# Patient Record
Sex: Male | Born: 1955 | Race: Black or African American | Hispanic: No | Smoking: Former smoker
Health system: Southern US, Community
[De-identification: ages and names within clinical notes are randomized; demographics above are authoritative.]

## PROBLEM LIST (undated history)

## (undated) DIAGNOSIS — Z87891 Personal history of nicotine dependence: Secondary | ICD-10-CM

## (undated) DIAGNOSIS — E039 Hypothyroidism, unspecified: Secondary | ICD-10-CM

## (undated) DIAGNOSIS — G629 Polyneuropathy, unspecified: Secondary | ICD-10-CM

## (undated) DIAGNOSIS — E785 Hyperlipidemia, unspecified: Secondary | ICD-10-CM

## (undated) DIAGNOSIS — N529 Male erectile dysfunction, unspecified: Secondary | ICD-10-CM

## (undated) DIAGNOSIS — N2581 Secondary hyperparathyroidism of renal origin: Secondary | ICD-10-CM

## (undated) DIAGNOSIS — E1122 Type 2 diabetes mellitus with diabetic chronic kidney disease: Secondary | ICD-10-CM

## (undated) DIAGNOSIS — E669 Obesity, unspecified: Secondary | ICD-10-CM

## (undated) DIAGNOSIS — E119 Type 2 diabetes mellitus without complications: Secondary | ICD-10-CM

## (undated) DIAGNOSIS — N184 Chronic kidney disease, stage 4 (severe): Secondary | ICD-10-CM

## (undated) DIAGNOSIS — I10 Essential (primary) hypertension: Secondary | ICD-10-CM

## (undated) DIAGNOSIS — G4733 Obstructive sleep apnea (adult) (pediatric): Secondary | ICD-10-CM

## (undated) DIAGNOSIS — E8722 Chronic metabolic acidosis: Secondary | ICD-10-CM

## (undated) HISTORY — DX: Hyperlipidemia, unspecified: E78.5

## (undated) HISTORY — DX: Hypothyroidism, unspecified: E03.9

## (undated) HISTORY — PX: CHOLECYSTECTOMY: SHX55

## (undated) HISTORY — DX: Type 2 diabetes mellitus with diabetic chronic kidney disease: E11.22

## (undated) HISTORY — PX: COLONOSCOPY: SHX174

## (undated) HISTORY — DX: Polyneuropathy, unspecified: G62.9

## (undated) HISTORY — PX: HERNIA REPAIR: SHX51

## (undated) HISTORY — DX: Essential (primary) hypertension: I10

## (undated) HISTORY — DX: Obstructive sleep apnea (adult) (pediatric): G47.33

## (undated) HISTORY — DX: Type 2 diabetes mellitus without complications: E11.9

---

## 2005-04-06 ENCOUNTER — Ambulatory Visit: Payer: Self-pay | Admitting: Nephrology

## 2007-03-25 IMAGING — US US RENAL KIDNEY
1 series · 17 of 24 positions shown · non-contrast
Comparison: none

REASON FOR EXAM: Chronic Kidney Disease
COMMENTS:

[Series 1: us renal kidney · 17 of 24 slices shown]
[im 1/24]
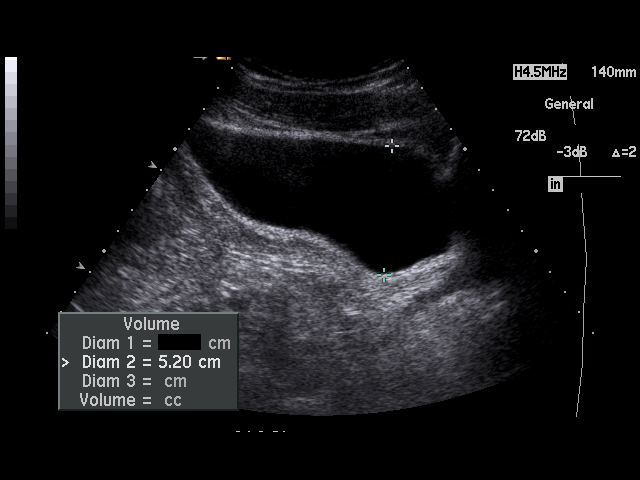
[im 3/24]
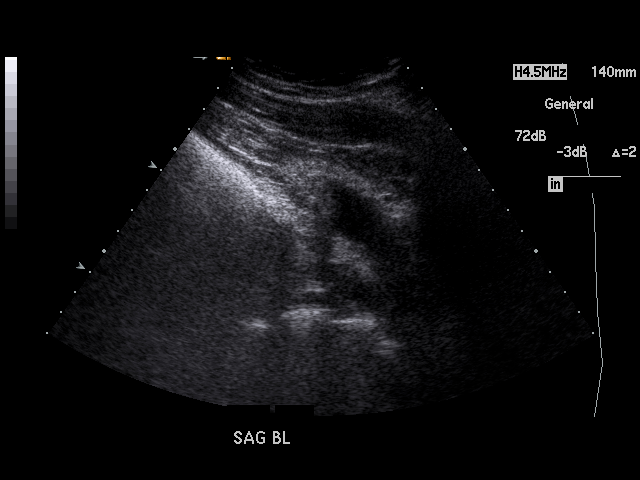
[im 4/24]
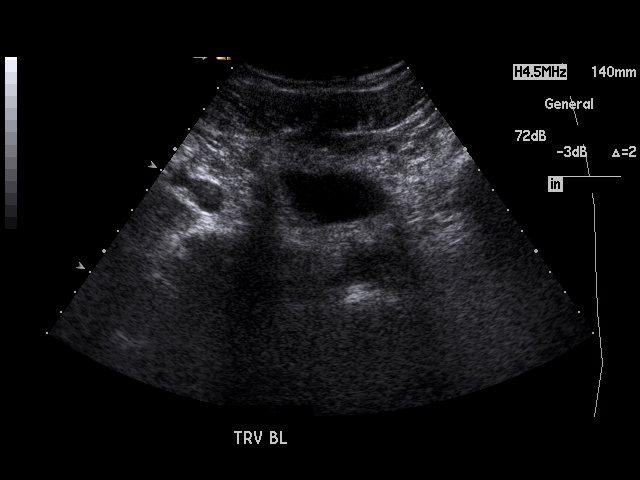
[im 5/24]
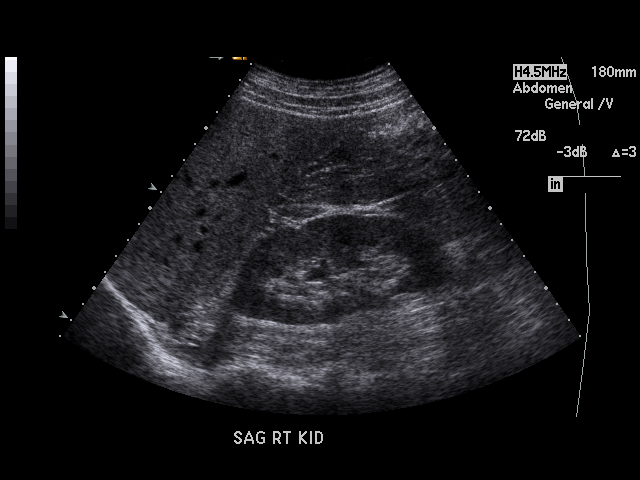
[im 7/24]
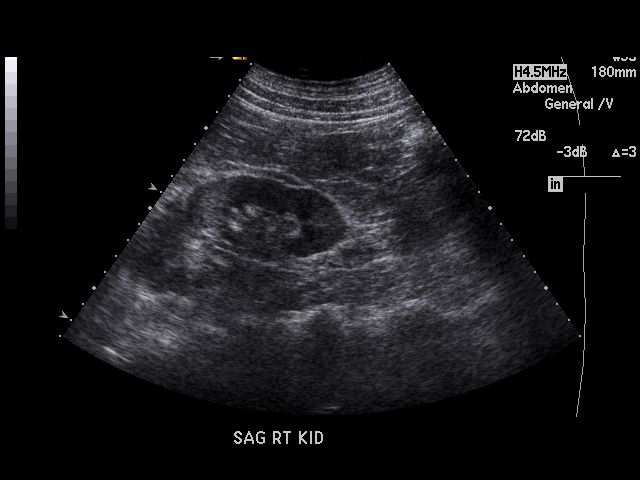
[im 8/24]
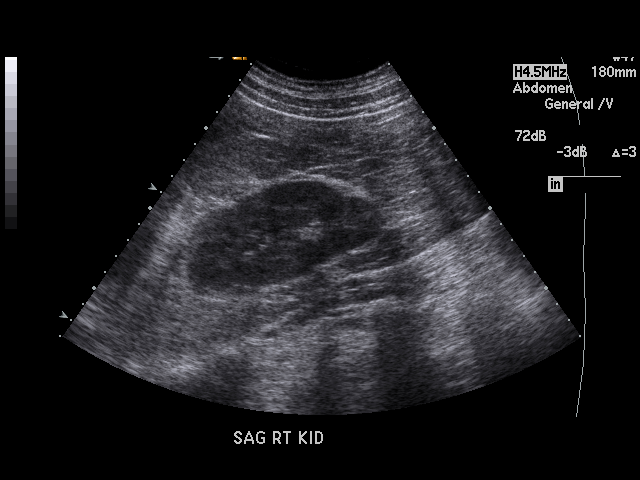
[im 10/24]
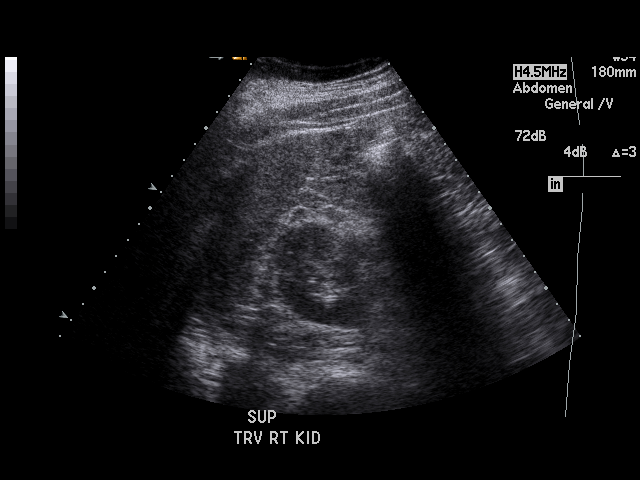
[im 11/24]
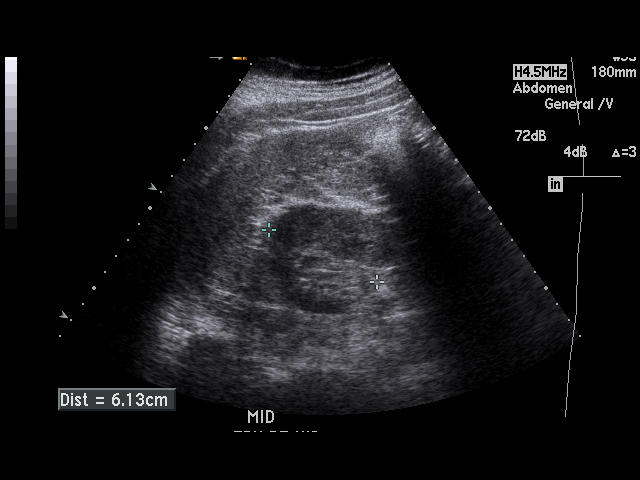
[im 13/24]
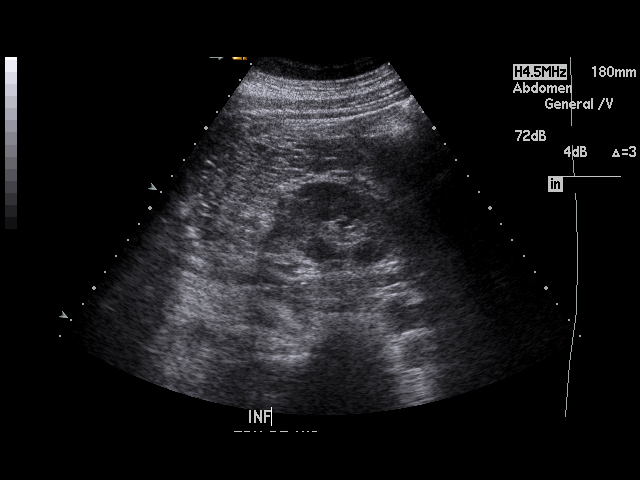
[im 14/24]
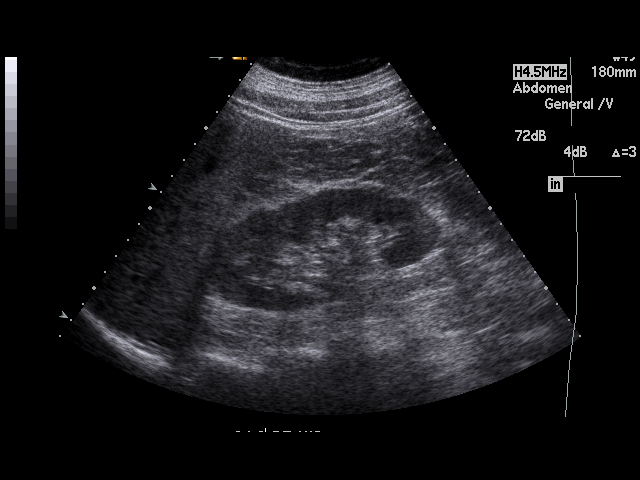
[im 15/24]
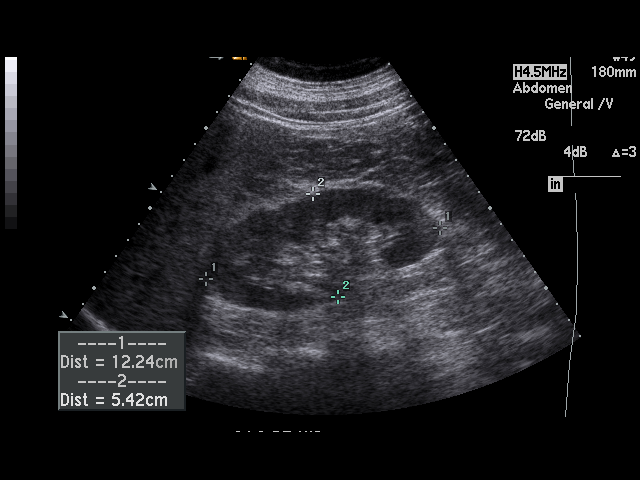
[im 17/24]
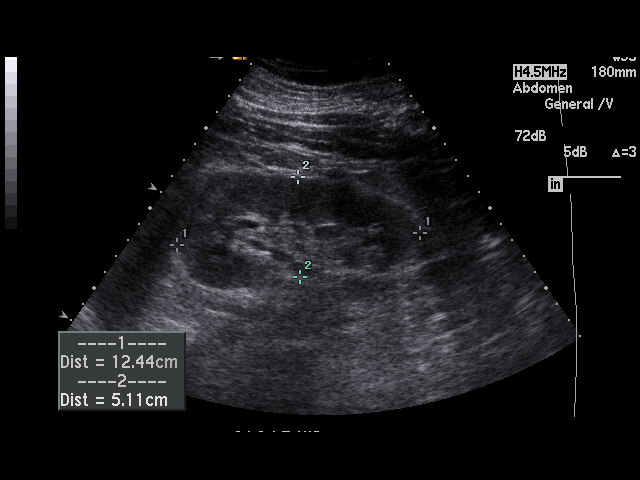
[im 18/24]
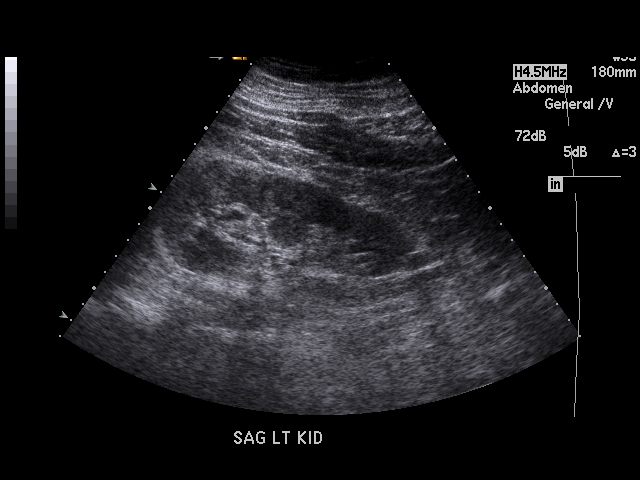
[im 20/24]
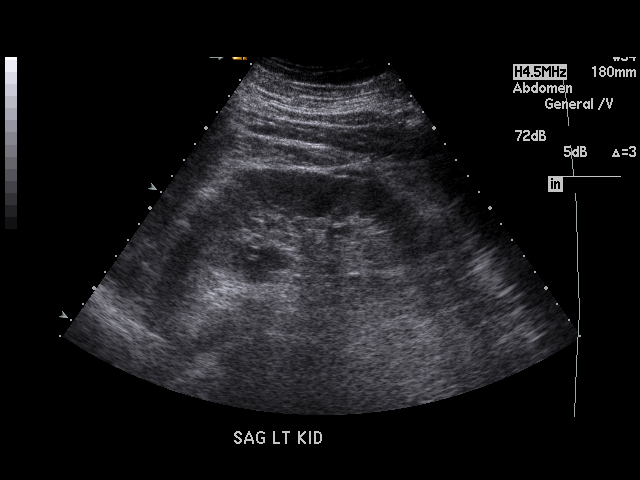
[im 21/24]
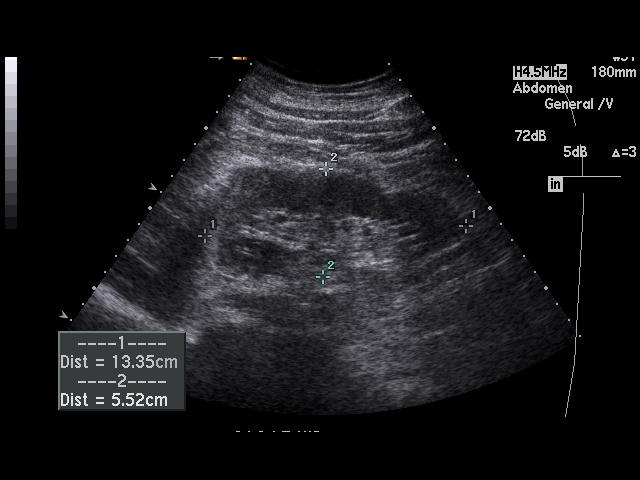
[im 22/24]
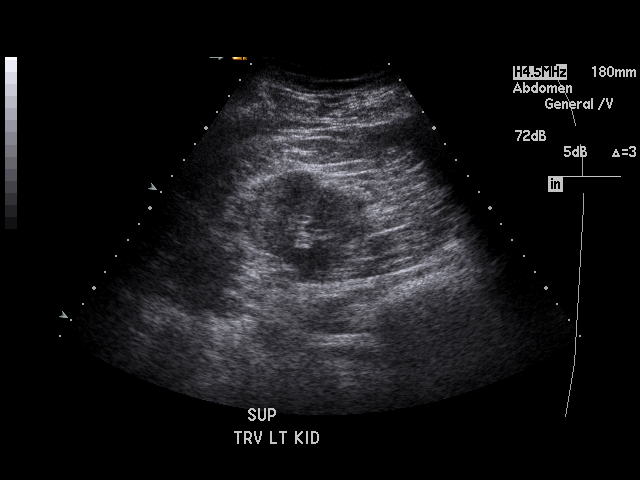
[im 24/24]
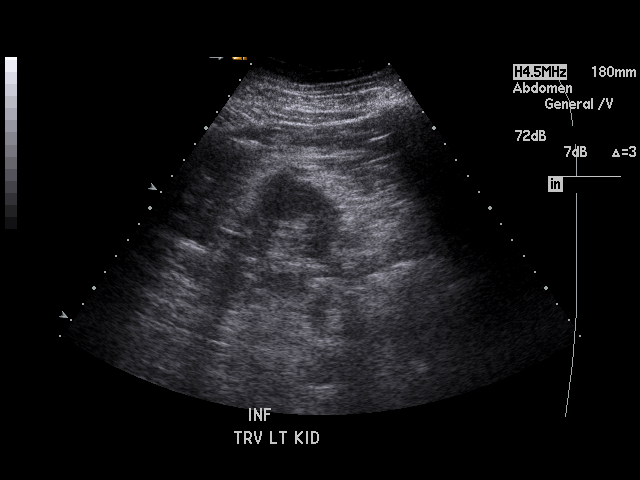

[17 of 24 positions shown; findings below may reference images not displayed]

PROCEDURE:     US  - US KIDNEY BILATERAL  - April 06, 2005  [DATE]

RESULT:        The RIGHT kidney measures 12.38 cm  x 6.13 cm x 5.57 cm and
the LEFT kidney measures 13.35 cm x 4.72 cm x 5.52 cm.  The renal cortical
margins are smooth.  No renal mass lesions or renal calcifications are
identified.  There is no hydronephrosis.  The filled urinary bladder has a
volume of approximately 219 ml.  Post-void examination shows near complete
emptying of the urinary bladder.
IMPRESSION: 1.     No acute changes are identified.
2.     The kidneys are normal in size.
3.     No hydronephrosis or renal mass lesions are identified.

## 2008-11-11 ENCOUNTER — Ambulatory Visit: Payer: Self-pay | Admitting: Gastroenterology

## 2010-05-20 ENCOUNTER — Ambulatory Visit: Payer: Self-pay | Admitting: Internal Medicine

## 2010-05-26 ENCOUNTER — Ambulatory Visit: Payer: Self-pay | Admitting: Internal Medicine

## 2010-06-13 ENCOUNTER — Ambulatory Visit: Payer: Self-pay | Admitting: Internal Medicine

## 2016-08-31 DIAGNOSIS — E7800 Pure hypercholesterolemia, unspecified: Secondary | ICD-10-CM | POA: Insufficient documentation

## 2017-01-27 ENCOUNTER — Ambulatory Visit
Admission: RE | Admit: 2017-01-27 | Discharge: 2017-01-27 | Disposition: A | Discharge status: Home / Self Care | Payer: BC Managed Care – PPO | Source: Ambulatory Visit | Attending: Family Medicine | Admitting: Family Medicine

## 2017-01-27 ENCOUNTER — Other Ambulatory Visit: Payer: Self-pay | Admitting: Family Medicine

## 2017-01-27 DIAGNOSIS — M47812 Spondylosis without myelopathy or radiculopathy, cervical region: Secondary | ICD-10-CM | POA: Insufficient documentation

## 2017-01-27 DIAGNOSIS — M542 Cervicalgia: Secondary | ICD-10-CM | POA: Insufficient documentation

## 2017-01-27 DIAGNOSIS — K118 Other diseases of salivary glands: Secondary | ICD-10-CM | POA: Diagnosis not present

## 2018-08-14 ENCOUNTER — Ambulatory Visit: Payer: BC Managed Care – PPO | Admitting: Dietician

## 2018-10-25 ENCOUNTER — Encounter: Payer: Self-pay | Admitting: Dietician

## 2018-10-25 NOTE — Progress Notes (Signed)
Patient cancelled his initial MNT appointment on 08/14/18 due to participation in a different program. Sent notification to referring provider.

## 2019-01-15 IMAGING — CT CT NECK W/O CM
4 of 5 series · 15 of 33 positions shown, 17 images · non-contrast
Comparison: None.

CLINICAL DATA: Neck pain.  Difficulty swallowing.

EXAM:
CT NECK WITHOUT CONTRAST
TECHNIQUE: Multidetector CT imaging of the neck was performed following the
standard protocol without intravenous contrast.

[Series 2: axial neck · axial · 0.55mm/px · z∈[-266,-126]mm · 4 of 118 slices shown, 5 images]
[im 24/118  soft-tissue]
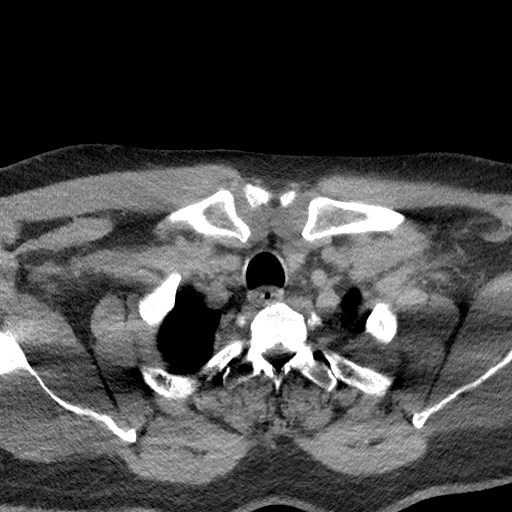
[im 24/118  bone]
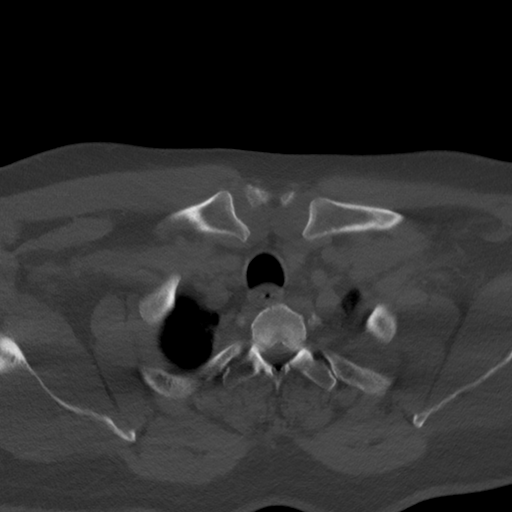
[im 47/118  bone]
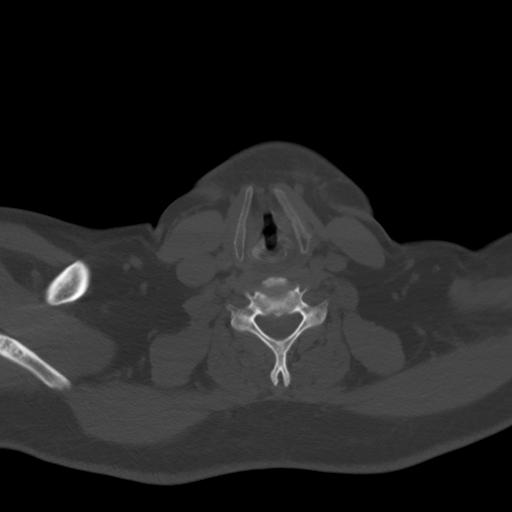
[im 71/118  bone]
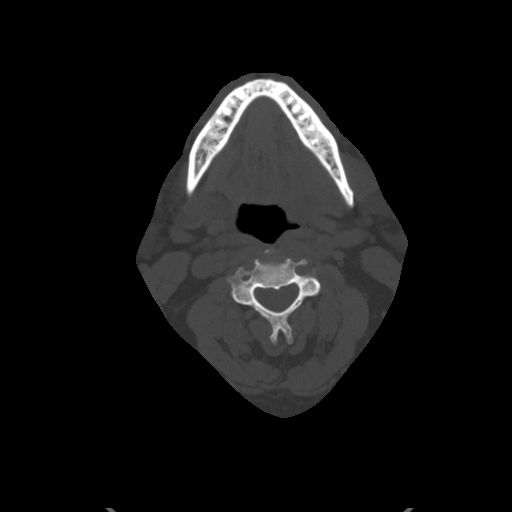
[im 94/118  bone]
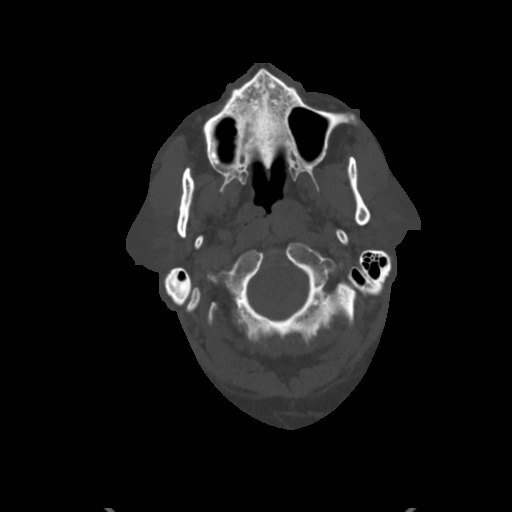

[Series 6: sag neck · sagittal · 0.48mm/px · 5 of 125 slices shown, 6 images]
[im 42/125  bone]
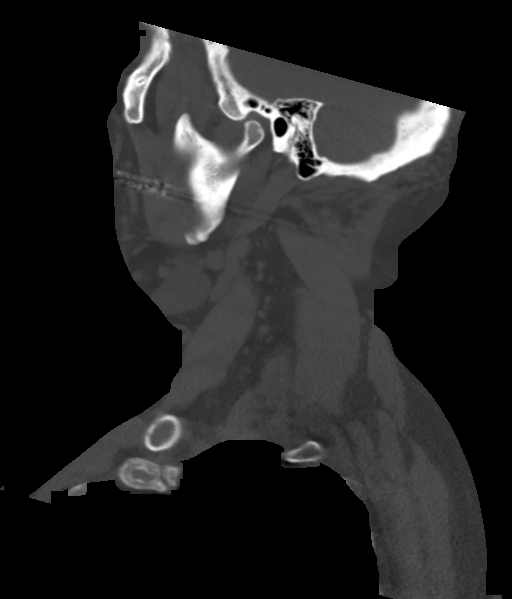
[im 52/125  bone]
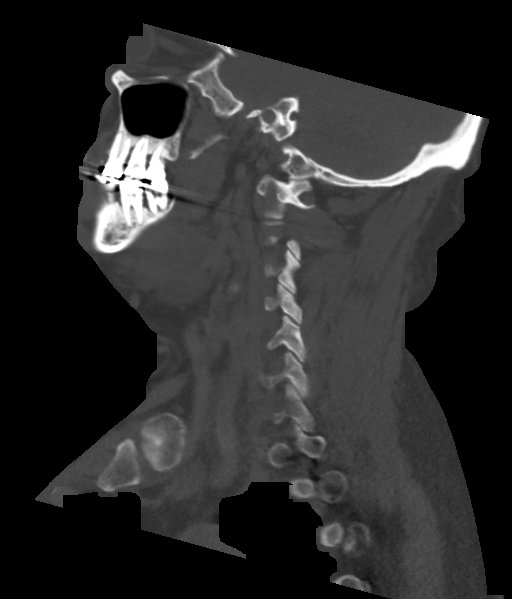
[im 63/125  soft-tissue]
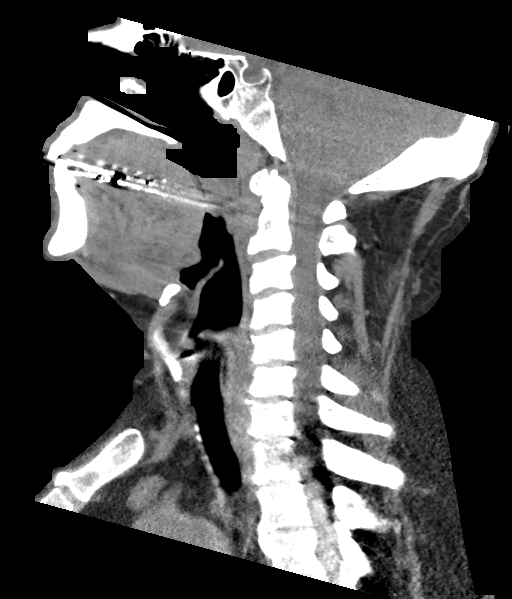
[im 63/125  bone]
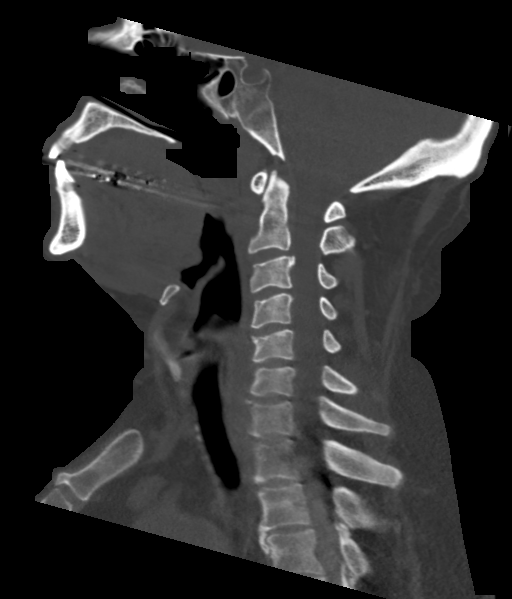
[im 73/125  bone]
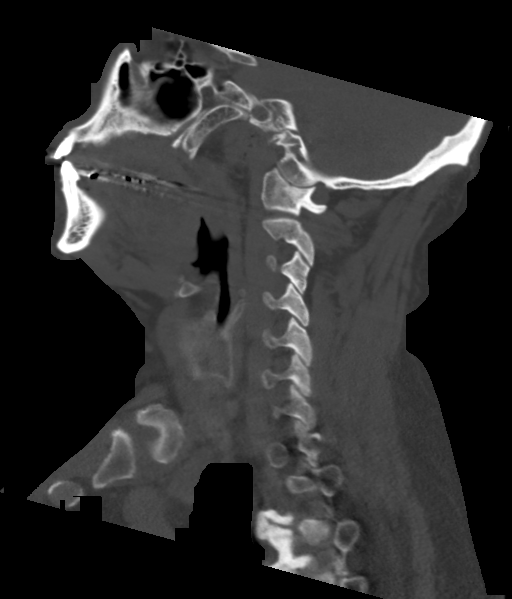
[im 83/125  bone]
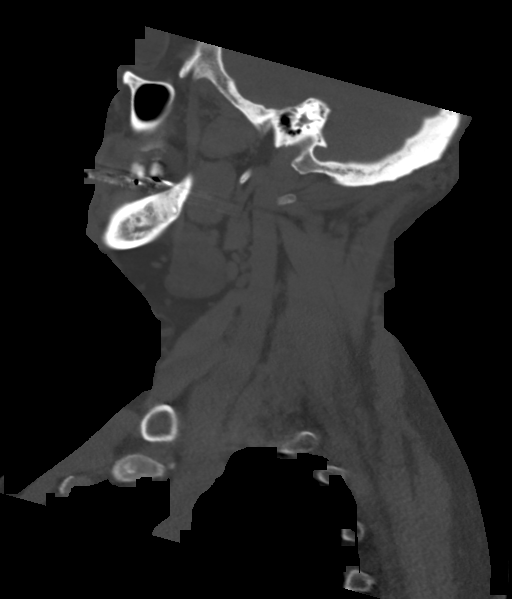

[Series 7: cor neck · coronal · 0.49mm/px · 3 of 92 slices shown]
[im 19/92  bone]
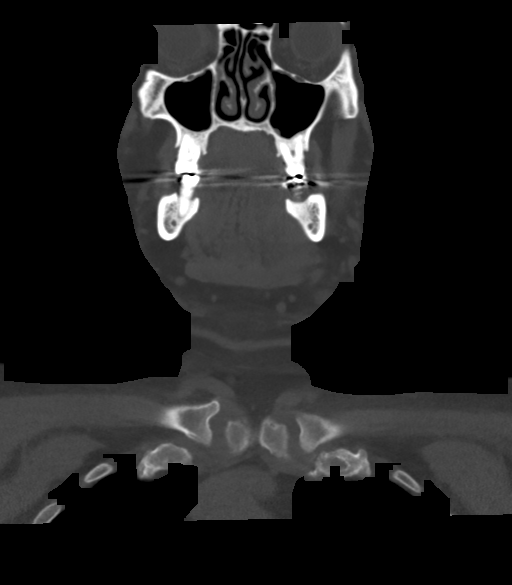
[im 37/92  bone]
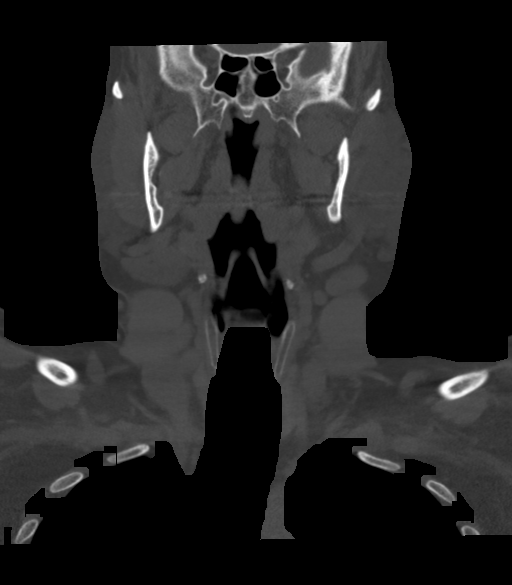
[im 55/92  bone]
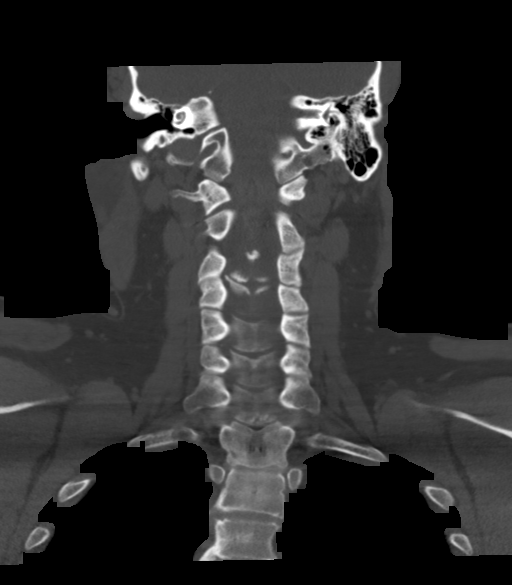

[Series 8: orthogonal ax · axial · 0.48mm/px · z∈[-289,-202]mm · 3 of 113 slices shown]
[im 23/113  bone]
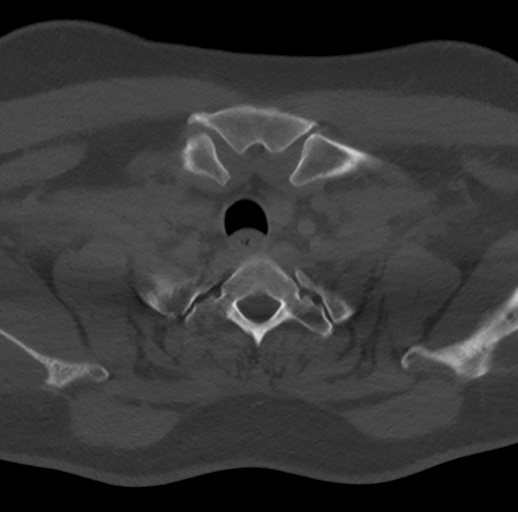
[im 45/113  bone]
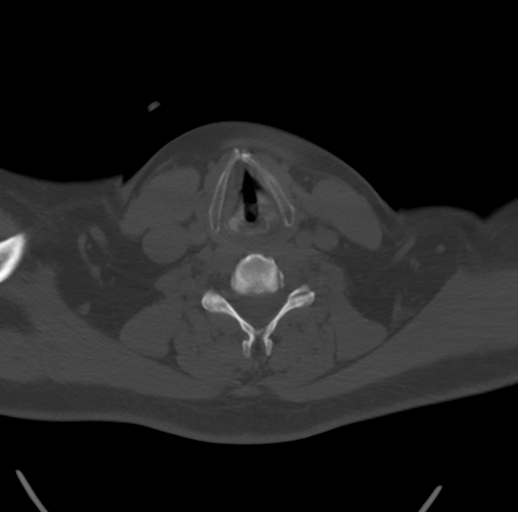
[im 68/113  bone]
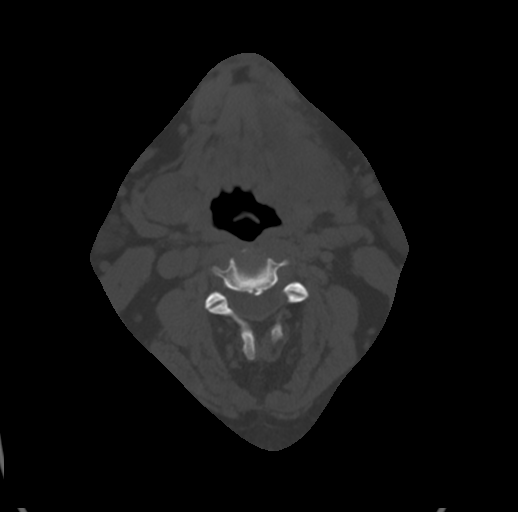

[15 of 33 positions shown; findings below may reference images not displayed]

FINDINGS: Pharynx and larynx: No focal mucosal or submucosal lesions are
present. Vocal cords are midline and symmetric. The oropharynx,
nasopharynx, and hypopharynx are clear. The soft palate and
epiglottis are within normal limits.

Salivary glands: The parotid glands are within normal limits
bilaterally. The left submandibular gland is enlarged and hypodense
compared to the right. There is marked dilation of the left
sublingual duct edema along the left floor of mouth. No definite
obstructing stone is present. There is displacement of the
mylohyoid. No discrete mass lesion is present. The right
submandibular gland is within normal limits.

Thyroid: The thyroid is heterogeneous. Multiple small nodules are
present. The dominant nodule is in the left lobe of the thyroid
inferiorly, measuring 1.6 cm.

Lymph nodes: Subcentimeter submandibular and level 2 lymph nodes
bilaterally are likely reactive. Nodes are more prominent left than
right.

Vascular: No significant vascular calcifications are present.

Limited intracranial: Within normal limits.

Visualized orbits: Globes and orbits are unremarkable.

Mastoids and visualized paranasal sinuses: The visualized paranasal
sinuses and mastoid air cells are clear.

Skeleton: Vertebral body heights and alignment are normal.
Uncovertebral spurring and foraminal narrowing is greatest at C3-4.
No focal lytic or blastic lesions are present.

Upper chest: The lung apices are clear. Superior mediastinum is
within normal limits. The thoracic inlet is otherwise unremarkable.
IMPRESSION: 1. Enlarged left submandibular duct and gland with edematous changes
compatible with obstruction and inflammation. Secondary infection is
not excluded.
2. Mass effect with displacement of the mylohyoid muscle laterally
and inferiorly.
3. Reactive type left greater than right level 1 and level 2 lymph
nodes.
4. No discrete mass lesion.
5. Mild degenerative changes of the cervical spine.

## 2020-01-20 ENCOUNTER — Ambulatory Visit (INDEPENDENT_AMBULATORY_CARE_PROVIDER_SITE_OTHER): Payer: Medicare PPO | Admitting: Internal Medicine

## 2020-01-20 DIAGNOSIS — G4733 Obstructive sleep apnea (adult) (pediatric): Secondary | ICD-10-CM | POA: Diagnosis not present

## 2020-01-20 DIAGNOSIS — Z9989 Dependence on other enabling machines and devices: Secondary | ICD-10-CM

## 2020-01-20 DIAGNOSIS — E039 Hypothyroidism, unspecified: Secondary | ICD-10-CM | POA: Insufficient documentation

## 2020-01-20 DIAGNOSIS — Z7189 Other specified counseling: Secondary | ICD-10-CM | POA: Insufficient documentation

## 2020-01-20 DIAGNOSIS — Z6836 Body mass index (BMI) 36.0-36.9, adult: Secondary | ICD-10-CM | POA: Insufficient documentation

## 2020-01-20 NOTE — Patient Instructions (Signed)

## 2020-01-20 NOTE — Progress Notes (Signed)
[facility] [place], [place] [address]  Pulmonary Sleep Medicine   Office Visit Note  Patient Name: Luke Gibson  DOB: 05-29-1955 MRN [id number]    Chief Complaint: Obstructive Sleep Apnea visit  Brief History:  Luke Gibson is seen today for initial consultation The patient has a 13 year history of sleep apnea and just received a replacement CPAP. Patient is using PAP nightly. He does not skip nights and loves his CPAP. The patient feels more rested after sleeping with PAP.  The patient reports significant benefit from PAP use. Reported sleepiness is  improved and the Epworth Sleepiness Score is 2 out of 24. The patient does ot take naps. The patient complains of the following: condensation  The compliance download shows excellent compliance with an average use time of 7.3 hours. The AHI is 0.3  The patient does not complain of limb movements disrupting sleep.  ROS  General: (-) fever, (-) chills, (-) night sweat Nose and Sinuses: (-) nasal stuffiness or itchiness, (-) postnasal drip, (-) nosebleeds, (-) sinus trouble. Mouth and Throat: (-) sore throat, (-) hoarseness. Neck: (-) swollen glands, (-) enlarged thyroid, (-) neck pain. Respiratory: - cough, - shortness of breath, - wheezing. Neurologic: - numbness, - tingling. Psychiatric: - anxiety, - depression   Current Medication: Outpatient Encounter Medications as of 01/20/2020  Medication Sig  . doxazosin (CARDURA) 2 MG tablet   . levothyroxine (SYNTHROID) 137 MCG tablet Take by mouth.  . quinapril (ACCUPRIL) 40 MG tablet   . Cholecalciferol 25 MCG (1000 UT) capsule Take 1 capsule by mouth every morning.  . Garlic Oil 7169 MG CAPS Take by mouth.  Luke Gibson Berries 565 MG CAPS Take by mouth.   No facility-administered encounter medications on file as of 01/20/2020.    Surgical History: Past Surgical History:  Procedure Laterality Date  . CHOLECYSTECTOMY      Medical History: Past Medical  History:  Diagnosis Date  . CKD stage 4 due to type 2 diabetes mellitus ([facility])   . HLD (hyperlipidemia)   . HTN (hypertension)   . Hypothyroidism   . Neuropathy   . OSA on CPAP   . T2DM (type 2 diabetes mellitus) ([facility])     Family History: Non contributory to the present illness  Social History: Social History   Socioeconomic History  . Marital status: Single    Spouse name: Not on file  . Number of children: Not on file  . Years of education: Not on file  . Highest education level: Not on file  Occupational History  . Not on file  Tobacco Use  . Smoking status: Former Research scientist (life sciences)  . Smokeless tobacco: Never Used  Substance and Sexual Activity  . Alcohol use: Not on file  . Drug use: Not on file  . Sexual activity: Not on file  Other Topics Concern  . Not on file  Social History Narrative  . Not on file   Social Determinants of Health   Financial Resource Strain:   . Difficulty of Paying Living Expenses: Not on file  Food Insecurity:   . Worried About Charity fundraiser in the Last Year: Not on file  . Ran Out of Food in the Last Year: Not on file  Transportation Needs:   . Lack of Transportation (Medical): Not on file  . Lack of Transportation (Non-Medical): Not on file  Physical Activity:   . Days of Exercise per Week: Not on file  . Minutes of Exercise per  Session: Not on file  Stress:   . Feeling of Stress : Not on file  Social Connections:   . Frequency of Communication with Friends and Family: Not on file  . Frequency of Social Gatherings with Friends and Family: Not on file  . Attends Religious Services: Not on file  . Active Member of Clubs or Organizations: Not on file  . Attends Archivist Meetings: Not on file  . Marital Status: Not on file  Intimate Partner Violence:   . Fear of Current or Ex-Partner: Not on file  . Emotionally Abused: Not on file  . Physically Abused: Not on file  . Sexually Abused: Not on file    Vital Signs: Blood  pressure (!) 153/93, pulse 71, height 5\' 11"  (1.803 m), weight 260 lb (117.9 kg), SpO2 98 %.  Examination: General Appearance: The patient is well-developed, well-nourished, and in no distress. Neck Circumference: 43 Skin: Gross inspection of skin unremarkable. Head: normocephalic, no gross deformities. Eyes: no gross deformities noted. ENT: ears appear grossly normal Neurologic: Alert and oriented. No involuntary movements.    EPWORTH SLEEPINESS SCALE:  Scale:  (0)= no chance of dozing; (1)= slight chance of dozing; (2)= moderate chance of dozing; (3)= high chance of dozing  Chance  Situtation    Sitting and reading: 0    Watching TV: 1    Sitting Inactive in public: 0    As a passenger in car: 0      Lying down to rest: 1    Sitting and talking: 0    Sitting quielty after lunch: 0    In a car, stopped in traffic: 0   TOTAL SCORE:   2 out of 24    SLEEP STUDIES:  1. Split 11/08/06 AHI 114 SpO41min 81%, CPAP 10 cm H2O   CPAP COMPLIANCE DATA:  Date Range: 12/18/19  Average Daily Use: 01/16/20 hours  Median Use: 7.3  Compliance for > 4 Hours: 7.3 days  AHI: 100%iratory events per hour  Days Used: 0.3  Mask Leak: 4.6  95th Percentile Pressure: 10   LABS: No results found for this or any previous visit (from the past 2160 hour(s)).  Radiology: CT SOFT TISSUE NECK WO CONTRAST  Result Date: 01/27/2017 CLINICAL DATA:  Neck pain.  Difficulty swallowing. EXAM: CT NECK WITHOUT CONTRAST TECHNIQUE: Multidetector CT imaging of the neck was performed following the standard protocol without intravenous contrast. COMPARISON:  None. FINDINGS: Pharynx and larynx: No focal mucosal or submucosal lesions are present. Vocal cords are midline and symmetric. The oropharynx, nasopharynx, and hypopharynx are clear. The soft palate and epiglottis are within normal limits. Salivary glands: The parotid glands are within normal limits bilaterally. The left submandibular gland is  enlarged and hypodense compared to the right. There is marked dilation of the left sublingual duct edema along the left floor of mouth. No definite obstructing stone is present. There is displacement of the mylohyoid. No discrete mass lesion is present. The right submandibular gland is within normal limits. Thyroid: The thyroid is heterogeneous. Multiple small nodules are present. The dominant nodule is in the left lobe of the thyroid inferiorly, measuring 1.6 cm. Lymph nodes: Subcentimeter submandibular and level 2 lymph nodes bilaterally are likely reactive. Nodes are more prominent left than right. Vascular: No significant vascular calcifications are present. Limited intracranial: Within normal limits. Visualized orbits: Globes and orbits are unremarkable. Mastoids and visualized paranasal sinuses: The visualized paranasal sinuses and mastoid air cells are clear. Skeleton: Vertebral body heights  and alignment are normal. Uncovertebral spurring and foraminal narrowing is greatest at C3-4. No focal lytic or blastic lesions are present. Upper chest: The lung apices are clear. Superior mediastinum is within normal limits. The thoracic inlet is otherwise unremarkable. IMPRESSION: 1. Enlarged left submandibular duct and gland with edematous changes compatible with obstruction and inflammation. Secondary infection is not excluded. 2. Mass effect with displacement of the mylohyoid muscle laterally and inferiorly. 3. Reactive type left greater than right level 1 and level 2 lymph nodes. 4. No discrete mass lesion. 5. Mild degenerative changes of the cervical spine. Electronically Signed   By: San Morelle M.D.   On: 01/27/2017 14:01    No results found.  No results found.    Assessment and Plan: Patient Active Problem List   Diagnosis Date Noted  . OSA on CPAP 01/20/2020  . CPAP use counseling 01/20/2020  . Hypothyroidism 01/20/2020  . BMI 36.0-36.9,adult 01/20/2020      The patient does  tolerate PAP and reports significant benefit from PAP use. The patient is caring for his machine as advised and advised to add heated tubing. The patient was also counselled on the importance of weight loss in treating the sleep apnea. The compliance is excellent. The apnea is very well controlled.   1. OSA- continue excellent compliance. 2. CPAP couseling-Discussed importance of adequate CPAP use as well as proper care and cleaning techniques of machine and all supplies. 3. Hypothyroidism-Levels stable and well controlled on current dose of levothyroxine per his report, managed by PCP 4. BMI 36-Discussed the importance of weight management through healthy eating and daily exercise as tolerated. Discussed the negative effects obesity has on pulmonary health, cardiac health as well as overall general health and well being.  General Counseling: I have discussed the findings of the evaluation and examination with Luke Gibson.  I have also discussed any further diagnostic evaluation thatmay be needed or ordered today. Luke Gibson verbalizes understanding of the findings of todays visit. We also reviewed his medications today and discussed drug interactions and side effects including but not limited excessive drowsiness and altered mental states. We also discussed that there is always a risk not just to him but also people around him. he has been encouraged to call the office with any questions or concerns that should arise related to todays visit.  I have personally obtained a history, examined the patient, evaluated laboratory and imaging results, formulated the assessment and plan and placed orders.  This patient was seen by Luiz Ochoa, AGNP-C in collaboration with Dr. Devona Konig as a part of collaborative care agreement.  Luke Ito Saunders Glance, PhD, FAASM  Diplomate, American Board of Sleep Medicine    Luke Gee, MD [facility] Diplomate ABMS Pulmonary and Critical Care Medicine Sleep medicine

## 2021-01-18 ENCOUNTER — Ambulatory Visit (INDEPENDENT_AMBULATORY_CARE_PROVIDER_SITE_OTHER): Payer: Medicare PPO | Admitting: Internal Medicine

## 2021-01-18 ENCOUNTER — Other Ambulatory Visit: Payer: Self-pay

## 2021-01-18 VITALS — BP 171/98 | HR 80 | Resp 18 | Ht 71.0 in | Wt 265.0 lb

## 2021-01-18 DIAGNOSIS — G4733 Obstructive sleep apnea (adult) (pediatric): Secondary | ICD-10-CM | POA: Diagnosis not present

## 2021-01-18 DIAGNOSIS — I10 Essential (primary) hypertension: Secondary | ICD-10-CM | POA: Diagnosis not present

## 2021-01-18 DIAGNOSIS — E669 Obesity, unspecified: Secondary | ICD-10-CM | POA: Insufficient documentation

## 2021-01-18 DIAGNOSIS — E1122 Type 2 diabetes mellitus with diabetic chronic kidney disease: Secondary | ICD-10-CM | POA: Insufficient documentation

## 2021-01-18 DIAGNOSIS — Z9989 Dependence on other enabling machines and devices: Secondary | ICD-10-CM

## 2021-01-18 DIAGNOSIS — E039 Hypothyroidism, unspecified: Secondary | ICD-10-CM | POA: Insufficient documentation

## 2021-01-18 DIAGNOSIS — Z7189 Other specified counseling: Secondary | ICD-10-CM

## 2021-01-18 DIAGNOSIS — N184 Chronic kidney disease, stage 4 (severe): Secondary | ICD-10-CM | POA: Insufficient documentation

## 2021-01-18 DIAGNOSIS — N529 Male erectile dysfunction, unspecified: Secondary | ICD-10-CM | POA: Insufficient documentation

## 2021-01-18 NOTE — Patient Instructions (Signed)

## 2021-01-18 NOTE — Progress Notes (Signed)
[facility] [address], [place] [id number]  Pulmonary Sleep Medicine   Office Visit Note  Patient Name: Luke Gibson DOB: [birth year] MRN [id number]    Chief Complaint: Obstructive Sleep Apnea visit  Brief History:  Luke Gibson is seen today for one year follow up.  The patient has a 14 year history of sleep apnea. Patient is using PAP nightly 10 cmH2O.  The patient feels refreshed after sleeping with PAP.  The patient reports benefiting from PAP use.  Epworth Sleepiness Score is 2 out of 24. The patient does not take naps. The patient complains of the following: no problems at this time  The compliance download shows  compliance with an average use time of 7:24 hours @ 100%. The AHI is 0.4  The patient does not complain of limb movements disrupting sleep.  ROS  General: (-) fever, (-) chills, (-) night sweat Nose and Sinuses: (-) nasal stuffiness or itchiness, (-) postnasal drip, (-) nosebleeds, (-) sinus trouble. Mouth and Throat: (-) sore throat, (-) hoarseness. Neck: (-) swollen glands, (-) enlarged thyroid, (-) neck pain. Respiratory: - cough, - shortness of breath, - wheezing. Neurologic: - numbness, - tingling. Psychiatric: - anxiety, - depression   Current Medication: Outpatient Encounter Medications as of 01/18/2021  Medication Sig   Cholecalciferol 25 MCG (1000 UT) capsule Take 1 capsule by mouth every morning.   doxazosin (CARDURA) 2 MG tablet    Garlic Oil 9833 MG CAPS Take by mouth.   Haw[place]orn Berries 565 MG CAPS Take by mouth.   levothyroxine (SYNTHROID) 137 MCG tablet Take by mouth.   quinapril (ACCUPRIL) 40 MG tablet    No facility-administered encounter medications on file as of 01/18/2021.    Surgical History: Past Surgical History:  Procedure Laterality Date   CHOLECYSTECTOMY      Medical History: Past Medical History:  Diagnosis Date   CKD stage 4 due to type 2 diabetes mellitus ([facility])    HLD (hyperlipidemia)    HTN  (hypertension)    Hypothyroidism    Neuropathy    OSA on CPAP    T2DM (type 2 diabetes mellitus) ([facility])     Family History: Non contributory to the present illness  Social History: Social History   Socioeconomic History   Marital status: Single    Spouse name: Not on file   Number of children: Not on file   Years of education: Not on file   Highest education level: Not on file  Occupational History   Not on file  Tobacco Use   Smoking status: Former   Smokeless tobacco: Never  Substance and Sexual Activity   Alcohol use: Not on file   Drug use: Not on file   Sexual activity: Not on file  Other Topics Concern   Not on file  Social History Narrative   Not on file   Social Determinants of Health   Financial Resource Strain: Not on file  Food Insecurity: Not on file  Transportation Needs: Not on file  Physical Activity: Not on file  Stress: Not on file  Social Connections: Not on file  Intimate Partner Violence: Not on file    Vital Signs: There were no vitals taken for this visit.  Examination: General Appearance: The patient is well-developed, well-nourished, and in no distress. Neck Circumference: 43 Skin: Gross inspection of skin unremarkable. Head: normocephalic, no gross deformities. Eyes: no gross deformities noted. ENT: ears appear grossly normal Neurologic: Alert and oriented. No involuntary movements.  EPWORTH SLEEPINESS SCALE:  Scale:  (0)= no chance of dozing; (1)= slight chance of dozing; (2)= moderate chance of dozing; (3)= high chance of dozing  Chance  Situtation    Sitting and reading: 0    Watching TV: 1    Sitting Inactive in public: 0    As a passenger in car: 0      Lying down to rest: 1    Sitting and talking: 0    Sitting quielty after lunch: 0    In a car, stopped in traffic: 0   TOTAL SCORE:   2 out of 24    SLEEP STUDIES:  Split 11/08/06 - AHI 114 SpO22min 81%   CPAP COMPLIANCE DATA:  Date Range: 01/14/20  - 01/12/21  Average Daily Use: 7:24 hours  Median Use: 7:22 hours  Compliance for > 4 Hours: 100%  AHI: 0.4 respiratory events per hour  Days Used: 364/365  Mask Leak: 3.9 lpm  95th Percentile Pressure: 10 cmH2O         LABS: No results found for this or any previous visit (from the past 2160 hour(s)).  Radiology: CT SOFT TISSUE NECK WO CONTRAST  Result Date: 01/27/2017 CLINICAL DATA:  Neck pain.  Difficulty swallowing. EXAM: CT NECK WITHOUT CONTRAST TECHNIQUE: Multidetector CT imaging of the neck was performed following the standard protocol without intravenous contrast. COMPARISON:  None. FINDINGS: Pharynx and larynx: No focal mucosal or submucosal lesions are present. Vocal cords are midline and symmetric. The oropharynx, nasopharynx, and hypopharynx are clear. The soft palate and epiglottis are within normal limits. Salivary glands: The parotid glands are within normal limits bilaterally. The left submandibular gland is enlarged and hypodense compared to the right. There is marked dilation of the left sublingual duct edema along the left floor of mouth. No definite obstructing stone is present. There is displacement of the mylohyoid. No discrete mass lesion is present. The right submandibular gland is within normal limits. Thyroid: The thyroid is heterogeneous. Multiple small nodules are present. The dominant nodule is in the left lobe of the thyroid inferiorly, measuring 1.6 cm. Lymph nodes: Subcentimeter submandibular and level 2 lymph nodes bilaterally are likely reactive. Nodes are more prominent left than right. Vascular: No significant vascular calcifications are present. Limited intracranial: Within normal limits. Visualized orbits: Globes and orbits are unremarkable. Mastoids and visualized paranasal sinuses: The visualized paranasal sinuses and mastoid air cells are clear. Skeleton: Vertebral body heights and alignment are normal. Uncovertebral spurring and foraminal  narrowing is greatest at C3-4. No focal lytic or blastic lesions are present. Upper chest: The lung apices are clear. Superior mediastinum is within normal limits. The thoracic inlet is otherwise unremarkable. IMPRESSION: 1. Enlarged left submandibular duct and gland with edematous changes compatible with obstruction and inflammation. Secondary infection is not excluded. 2. Mass effect with displacement of the mylohyoid muscle laterally and inferiorly. 3. Reactive type left greater than right level 1 and level 2 lymph nodes. 4. No discrete mass lesion. 5. Mild degenerative changes of the cervical spine. Electronically Signed   By: San Morelle M.D.   On: 01/27/2017 14:01    No results found.  No results found.    Assessment and Plan: Patient Active Problem List   Diagnosis Date Noted   OSA on CPAP 01/20/2020   CPAP use counseling 01/20/2020   Hypothyroidism 01/20/2020   BMI 36.0-36.9,adult 01/20/2020   1. OSA on CPAP The patient does tolerate PAP and reports definite benefit from PAP use. The patient was  reminded how to clean equipment and advised to replace supplies routinely. The patient was also counselled on weight loss. The compliance is excellent. The AHI is 0.4.   OSA- continue excellent compliance with pap. F/u one year.    2. CPAP use counseling CPAP Counseling: had a lengthy discussion with the patient regarding the importance of PAP therapy in management of the sleep apnea. Patient appears to understand the risk factor reduction and also understands the risks associated with untreated sleep apnea. Patient will try to make a good faith effort to remain compliant with therapy. Also instructed the patient on proper cleaning of the device including the water must be changed daily if possible and use of distilled water is preferred. Patient understands that the machine should be regularly cleaned with appropriate recommended cleaning solutions that do not damage the PAP machine  for example given white vinegar and water rinses. Other methods such as ozone treatment may not be as good as these simple methods to achieve cleaning.   3. Obesity (BMI 30-39.9) Obesity Counseling: Had a lengthy discussion regarding patients BMI and weight issues. Patient was instructed on portion control as well as increased activity. Also discussed caloric restrictions with trying to maintain intake less than 2000 Kcal. Discussions were made in accordance with the 5As of weight management. Simple actions such as not eating late and if able to, taking a walk is suggested.   4. Hypertension, unspecified type Hypertension Counseling:   The following hypertensive lifestyle modification were recommended and discussed:  1. Limiting alcohol intake to less than 1 oz/day of ethanol:(24 oz of beer or 8 oz of wine or 2 oz of 100-proof whiskey). 2. Take baby ASA 81 mg daily. 3. Importance of regular aerobic exercise and losing weight. 4. Reduce dietary saturated fat and cholesterol intake for overall cardiovascular health. 5. Maintaining adequate dietary potassium, calcium, and magnesium intake. 6. Regular monitoring of the blood pressure. 7. Reduce sodium intake to less than 100 mmol/day (less than 2.3 gm of sodium or less than 6 gm of sodium choride)       General Counseling: I have discussed the findings of the evaluation and examination with Luke Gibson.  I have also discussed any further diagnostic evaluation thatmay be needed or ordered today. Luke Gibson verbalizes understanding of the findings of todays visit. We also reviewed his medications today and discussed drug interactions and side effects including but not limited excessive drowsiness and altered mental states. We also discussed that there is always a risk not just to him but also people around him. he has been encouraged to call the office with any questions or concerns that should arise related to todays visit.  No orders of the defined types  were placed in this encounter.       I have personally obtained a history, examined the patient, evaluated laboratory and imaging results, formulated the assessment and plan and placed orders. This patient was seen today by Tressie Ellis, PA-C in collaboration with Dr. Devona Konig.   Allyne Gee, MD [facility] Diplomate ABMS Pulmonary Critical Care Medicine and Sleep Medicine

## 2022-01-16 NOTE — Progress Notes (Signed)
[facility] [place], [place] [address]  Pulmonary Sleep Medicine   Office Visit Note  Patient Name: Luke Gibson DOB: Jan 07, 1956 MRN [id number]    Chief Complaint: Obstructive Sleep Apnea visit  Brief History:  Luke Gibson is seen today for an annual follow up on CPAP@10cmh20 .  The patient has a 15 year history of sleep apnea. Patient is using PAP nightly.  The patient feels rested after sleeping with PAP.  The patient reports benefiting from PAP use. Reported sleepiness is  improved and the Epworth Sleepiness Score is 2 out of 24. The patient does not take naps. The patient complains of the following: No complaints.  The compliance download shows 100% compliance with an average use time of 7.5 hours. The AHI is 0.2  The patient does not complain of limb movements disrupting sleep.  ROS  General: (-) fever, (-) chills, (-) night sweat Nose and Sinuses: (-) nasal stuffiness or itchiness, (-) postnasal drip, (-) nosebleeds, (-) sinus trouble. Mouth and Throat: (-) sore throat, (-) hoarseness. Neck: (-) swollen glands, (-) enlarged thyroid, (-) neck pain. Respiratory: - cough, - shortness of breath, - wheezing. Neurologic: - numbness, - tingling. Psychiatric: - anxiety, - depression   Current Medication: Outpatient Encounter Medications as of 01/17/2022  Medication Sig   Cholecalciferol 25 MCG (1000 UT) capsule Take 1 capsule by mouth every morning.   doxazosin (CARDURA) 2 MG tablet    Garlic Oil 5361 MG CAPS Take by mouth.   Hawthorn Berries 565 MG CAPS Take by mouth.   levothyroxine (SYNTHROID) 137 MCG tablet Take by mouth.   quinapril (ACCUPRIL) 40 MG tablet    tadalafil, PAH, (ADCIRCA) 20 MG tablet Take 1 tablet by mouth daily as needed.   No facility-administered encounter medications on file as of 01/17/2022.    Surgical History: Past Surgical History:  Procedure Laterality Date   CHOLECYSTECTOMY     HERNIA REPAIR      Medical  History: Past Medical History:  Diagnosis Date   CKD stage 4 due to type 2 diabetes mellitus (HCC)    HLD (hyperlipidemia)    HTN (hypertension)    Hypothyroidism    Neuropathy    OSA on CPAP    T2DM (type 2 diabetes mellitus) (HCC)     Family History: Non contributory to the present illness  Social History: Social History   Socioeconomic History   Marital status: Single    Spouse name: Not on file   Number of children: Not on file   Years of education: Not on file   Highest education level: Not on file  Occupational History   Not on file  Tobacco Use   Smoking status: Former   Smokeless tobacco: Never  Substance and Sexual Activity   Alcohol use: Not on file   Drug use: Not on file   Sexual activity: Not on file  Other Topics Concern   Not on file  Social History Narrative   Not on file   Social Determinants of Health   Financial Resource Strain: Not on file  Food Insecurity: Not on file  Transportation Needs: Not on file  Physical Activity: Not on file  Stress: Not on file  Social Connections: Not on file  Intimate Partner Violence: Not on file    Vital Signs: There were no vitals taken for this visit. There is no height or weight on file to calculate BMI.    Examination: General Appearance: The patient is well-developed, well-nourished, and in  no distress. Neck Circumference: 44 cm Skin: Gross inspection of skin unremarkable. Head: normocephalic, no gross deformities. Eyes: no gross deformities noted. ENT: ears appear grossly normal Neurologic: Alert and oriented. No involuntary movements.  STOP BANG RISK ASSESSMENT S (snore) Have you been told that you snore?     NO   T (tired) Are you often tired, fatigued, or sleepy during the day?   NO  O (obstruction) Do you stop breathing, choke, or gasp during sleep? NO   P (pressure) Do you have or are you being treated for high blood pressure? YES   B (BMI) Is your body index greater than 35 kg/m?  YES   A (age) Are you 67 years old or older? YES   N (neck) Do you have a neck circumference greater than 16 inches?   YES   G (gender) Are you a male? YES   TOTAL STOP/BANG "YES" ANSWERS 5       A STOP-Bang score of 2 or less is considered low risk, and a score of 5 or more is high risk for having either moderate or severe OSA. For people who score 3 or 4, doctors may need to perform further assessment to determine how likely they are to have OSA.         EPWORTH SLEEPINESS SCALE:  Scale:  (0)= no chance of dozing; (1)= slight chance of dozing; (2)= moderate chance of dozing; (3)= high chance of dozing  Chance  Situtation    Sitting and reading: 0    Watching TV: 1    Sitting Inactive in public: 0    As a passenger in car: 0      Lying down to rest: 1    Sitting and talking: 0    Sitting quielty after lunch: 0    In a car, stopped in traffic: 0   TOTAL SCORE:   2 out of 24    SLEEP STUDIES:  Split 11/08/06 - AHI 114 SpO46min 81%    CPAP COMPLIANCE DATA:  Date Range: 01/15/21-01/14/2022  Average Daily Use: 7.5 hours  Median Use: 7.5 hrs  Compliance for > 4 Hours: 100% days  AHI: 0.2 respiratory events per hour  Days Used: 364/365  Mask Leak: 2.7  95th Percentile Pressure: 10         LABS: No results found for this or any previous visit (from the past 2160 hour(s)).  Radiology: CT SOFT TISSUE NECK WO CONTRAST  Result Date: 01/27/2017 CLINICAL DATA:  Neck pain.  Difficulty swallowing. EXAM: CT NECK WITHOUT CONTRAST TECHNIQUE: Multidetector CT imaging of the neck was performed following the standard protocol without intravenous contrast. COMPARISON:  None. FINDINGS: Pharynx and larynx: No focal mucosal or submucosal lesions are present. Vocal cords are midline and symmetric. The oropharynx, nasopharynx, and hypopharynx are clear. The soft palate and epiglottis are within normal limits. Salivary glands: The parotid glands are within normal limits  bilaterally. The left submandibular gland is enlarged and hypodense compared to the right. There is marked dilation of the left sublingual duct edema along the left floor of mouth. No definite obstructing stone is present. There is displacement of the mylohyoid. No discrete mass lesion is present. The right submandibular gland is within normal limits. Thyroid: The thyroid is heterogeneous. Multiple small nodules are present. The dominant nodule is in the left lobe of the thyroid inferiorly, measuring 1.6 cm. Lymph nodes: Subcentimeter submandibular and level 2 lymph nodes bilaterally are likely reactive. Nodes are more prominent left  than right. Vascular: No significant vascular calcifications are present. Limited intracranial: Within normal limits. Visualized orbits: Globes and orbits are unremarkable. Mastoids and visualized paranasal sinuses: The visualized paranasal sinuses and mastoid air cells are clear. Skeleton: Vertebral body heights and alignment are normal. Uncovertebral spurring and foraminal narrowing is greatest at C3-4. No focal lytic or blastic lesions are present. Upper chest: The lung apices are clear. Superior mediastinum is within normal limits. The thoracic inlet is otherwise unremarkable. IMPRESSION: 1. Enlarged left submandibular duct and gland with edematous changes compatible with obstruction and inflammation. Secondary infection is not excluded. 2. Mass effect with displacement of the mylohyoid muscle laterally and inferiorly. 3. Reactive type left greater than right level 1 and level 2 lymph nodes. 4. No discrete mass lesion. 5. Mild degenerative changes of the cervical spine. Electronically Signed   By: San Morelle M.D.   On: 01/27/2017 14:01    No results found.  No results found.    Assessment and Plan: Patient Active Problem List   Diagnosis Date Noted   Obesity (BMI 30-39.9) 01/18/2021   Hypertension 01/18/2021   CKD (chronic kidney disease) stage 4, GFR 15-29  ml/min (HCC) 01/18/2021   Erectile dysfunction 01/18/2021   Type 2 diabetes mellitus with stage 4 chronic kidney disease, without long-term current use of insulin ([place]) 01/18/2021   Essential hypertension 01/18/2021   Acquired hypothyroidism 01/18/2021   OSA on CPAP 01/20/2020   CPAP use counseling 01/20/2020   Hypothyroidism 01/20/2020   BMI 36.0-36.9,adult 01/20/2020   Pure hypercholesterolemia 08/31/2016   1. OSA on CPAP The patient does tolerate PAP and reports  benefit from PAP use. The patient was reminded how to clean equipment and advised to replace supplies routinely. The patient was also counselled on weight loss. The compliance is excellent. The AHI is 0.2.   OSA on cpap- CPAP continues to be medically necessary to treat this patient's OSA.  Continue with excellent compliance. F/u one year.    2. CPAP use counseling CPAP Counseling: had a lengthy discussion with the patient regarding the importance of PAP therapy in management of the sleep apnea. Patient appears to understand the risk factor reduction and also understands the risks associated with untreated sleep apnea. Patient will try to make a good faith effort to remain compliant with therapy. Also instructed the patient on proper cleaning of the device including the water must be changed daily if possible and use of distilled water is preferred. Patient understands that the machine should be regularly cleaned with appropriate recommended cleaning solutions that do not damage the PAP machine for example given white vinegar and water rinses. Other methods such as ozone treatment may not be as good as these simple methods to achieve cleaning.   3. Obesity (BMI 30-39.9) Obesity Counseling: Had a lengthy discussion regarding patients BMI and weight issues. Patient was instructed on portion control as well as increased activity. Also discussed caloric restrictions with trying to maintain intake less than 2000 Kcal. Discussions were  made in accordance with the 5As of weight management. Simple actions such as not eating late and if able to, taking a walk is suggested.     General Counseling: I have discussed the findings of the evaluation and examination with Legrand Como.  I have also discussed any further diagnostic evaluation thatmay be needed or ordered today. Deitrich verbalizes understanding of the findings of todays visit. We also reviewed his medications today and discussed drug interactions and side effects including but not limited excessive drowsiness  and altered mental states. We also discussed that there is always a risk not just to him but also people around him. he has been encouraged to call the office with any questions or concerns that should arise related to todays visit.  No orders of the defined types were placed in this encounter.       I have personally obtained a history, examined the patient, evaluated laboratory and imaging results, formulated the assessment and plan and placed orders. This patient was seen today by Tressie Ellis, PA-C in collaboration with Dr. Devona Konig.   Allyne Gee, MD [facility] Diplomate ABMS Pulmonary Critical Care Medicine and Sleep Medicine

## 2022-01-17 ENCOUNTER — Ambulatory Visit (INDEPENDENT_AMBULATORY_CARE_PROVIDER_SITE_OTHER): Payer: Medicare PPO | Admitting: Internal Medicine

## 2022-01-17 VITALS — BP 165/96 | HR 64 | Resp 16 | Ht 71.0 in | Wt 256.0 lb

## 2022-01-17 DIAGNOSIS — Z7189 Other specified counseling: Secondary | ICD-10-CM

## 2022-01-17 DIAGNOSIS — N183 Chronic kidney disease, stage 3 unspecified: Secondary | ICD-10-CM | POA: Insufficient documentation

## 2022-01-17 DIAGNOSIS — E669 Obesity, unspecified: Secondary | ICD-10-CM | POA: Diagnosis not present

## 2022-01-17 DIAGNOSIS — G4733 Obstructive sleep apnea (adult) (pediatric): Secondary | ICD-10-CM | POA: Diagnosis not present

## 2022-01-17 NOTE — Patient Instructions (Signed)

## 2022-04-12 DIAGNOSIS — N2581 Secondary hyperparathyroidism of renal origin: Secondary | ICD-10-CM | POA: Insufficient documentation

## 2022-08-02 ENCOUNTER — Encounter: Payer: Self-pay | Admitting: Surgery

## 2022-08-02 ENCOUNTER — Ambulatory Visit: Payer: Self-pay | Admitting: Surgery

## 2022-08-02 ENCOUNTER — Ambulatory Visit (INDEPENDENT_AMBULATORY_CARE_PROVIDER_SITE_OTHER): Payer: Medicare PPO | Admitting: Surgery

## 2022-08-02 ENCOUNTER — Telehealth: Payer: Self-pay | Admitting: Surgery

## 2022-08-02 VITALS — BP 183/83 | HR 79 | Temp 97.9°F | Ht 71.0 in | Wt 254.6 lb

## 2022-08-02 DIAGNOSIS — I129 Hypertensive chronic kidney disease with stage 1 through stage 4 chronic kidney disease, or unspecified chronic kidney disease: Secondary | ICD-10-CM

## 2022-08-02 DIAGNOSIS — E1122 Type 2 diabetes mellitus with diabetic chronic kidney disease: Secondary | ICD-10-CM

## 2022-08-02 DIAGNOSIS — N184 Chronic kidney disease, stage 4 (severe): Secondary | ICD-10-CM

## 2022-08-02 NOTE — H&P (View-Only) (Signed)
Patient ID: Luke Gibson, male   DOB: 10/19/1955, 66 y.o.   MRN: [id number]  Chief Complaint:  ESRD followed by Dr. Singh.   History of Present Illness Luke Gibson is a 66 y.o. male with hypertension, diet-controlled diabetes, obstructive sleep apnea, hypothyroidism and gout.   Hypertensive Chronic kidney disease stage 5 CKD risk factors include diabetes, hypertension, obesity(BMI 37.4) At present, he does not have any uremic symptoms. Patient has had home modalities education. He prefers peritoneal dialysis. Referred to general surgery for evaluation for embedded PD catheter placement. No uremic symptoms at present. Patient has attended kidney disease education class. In September and October 2023.  He has at kidney transplant evaluation at [facility] and is progressing slowly.  GFR has further declined to 8.  Prior surgical history includes umbilical hernia repair, and laparoscopic cholecystectomy.  He has not started or had any hemodialysis at this point.  He consumes a fair amount of water and therefore voids quite frequently.  Past Medical History Past Medical History:  Diagnosis Date   CKD stage 4 due to type 2 diabetes mellitus (HCC)    HLD (hyperlipidemia)    HTN (hypertension)    Hypothyroidism    Neuropathy    OSA on CPAP    T2DM (type 2 diabetes mellitus) (HCC)       Past Surgical History:  Procedure Laterality Date   CHOLECYSTECTOMY     HERNIA REPAIR      Allergies  Allergen Reactions   Amlodipine Other (See Comments)    Peripheral edema    Current Outpatient Medications  Medication Sig Dispense Refill   acetaminophen-codeine (TYLENOL #3) 300-30 MG tablet Take 1 tablet by mouth every 6 (six) hours as needed.     amLODipine (NORVASC) 5 MG tablet Take 5 mg by mouth daily.     aspirin EC 81 MG tablet Take 1 tablet by mouth daily.     Cholecalciferol 25 MCG (1000 UT) capsule Take 1 capsule by mouth every morning.     doxazosin (CARDURA) 4 MG  tablet Take by mouth.     Garlic Oil 1000 MG CAPS Take by mouth.     levothyroxine (SYNTHROID) 137 MCG tablet Take by mouth.     sildenafil (VIAGRA) 100 MG tablet Take by mouth.     sodium bicarbonate 650 MG tablet Take by mouth.     tadalafil, PAH, (ADCIRCA) 20 MG tablet Take 1 tablet by mouth daily as needed.     No current facility-administered medications for this visit.    Family History History reviewed. No pertinent family history.    Social History Social History   Tobacco Use   Smoking status: Former   Smokeless tobacco: Never        Review of Systems  Constitutional: Negative.   HENT:  Positive for tinnitus.   Eyes: Negative.   Cardiovascular: Negative.   Gastrointestinal: Negative.   Genitourinary: Negative.   Skin: Negative.   Neurological: Negative.   Psychiatric/Behavioral: Negative.       Physical Exam Blood pressure (!) 183/83, pulse 79, temperature 97.9 F (36.6 C), temperature source Oral, height 5' 11" (1.803 m), weight 254 lb 9.6 oz (115.5 kg), SpO2 99 %. Last Weight  Most recent update: 08/02/2022  9:40 AM    Weight  115.5 kg (254 lb 9.6 oz)             CONSTITUTIONAL: Well developed, and nourished, appropriately responsive and aware without distress.   EYES: Sclera non-icteric.     EARS, NOSE, MOUTH AND THROAT:  The oropharynx is clear. Oral mucosa is pink and moist.     Hearing is intact to voice.  NECK: Trachea is midline, and there is no jugular venous distension.  LYMPH NODES:  Lymph nodes in the neck are not appreciated. RESPIRATORY:  Lungs are clear, and breath sounds are equal bilaterally.  Normal respiratory effort without pathologic use of accessory muscles. CARDIOVASCULAR: Heart is regular in rate and rhythm.   Well perfused.  GI: The abdomen is  soft, nontender, and nondistended.  There is an umbilical scar, with this degree of irregularity present, no appreciable recurrent hernia, there is diastases recti.  There were no palpable  masses.  I did not appreciate hepatosplenomegaly. MUSCULOSKELETAL:  Symmetrical muscle tone appreciated in all four extremities.    SKIN: Skin turgor is normal. No pathologic skin lesions appreciated.  NEUROLOGIC:  Motor and sensation appear grossly normal.  Cranial nerves are grossly without defect. PSYCH:  Alert and oriented to person, place and time. Affect is appropriate for situation.  Data Reviewed I have personally reviewed what is currently available of the patient's imaging, recent labs and medical records.   Labs:      No data to display             No data to display            Imaging: Radiological images reviewed:   Within last 24 hrs: No results found.  Assessment    Chronic kidney disease stage IV Patient Active Problem List   Diagnosis Date Noted   Chronic kidney disease, stage 3 (HCC) 01/17/2022   Obesity (BMI 30-39.9) 01/18/2021   Hypertension 01/18/2021   CKD (chronic kidney disease) stage 4, GFR 15-29 ml/min (HCC) 01/18/2021   Erectile dysfunction 01/18/2021   Type 2 diabetes mellitus with stage 4 chronic kidney disease, without long-term current use of insulin (HCC) 01/18/2021   Essential hypertension 01/18/2021   Acquired hypothyroidism 01/18/2021   OSA on CPAP 01/20/2020   CPAP use counseling 01/20/2020   Hypothyroidism 01/20/2020   BMI 36.0-36.9,adult 01/20/2020   Pure hypercholesterolemia 08/31/2016    Plan    Laparoscopic placement of embedded CAPD catheter.  Possible omentopexy.  We discussed the risks of the above which include but are not limited to anesthesia, bleeding, infection, catheter dysfunction, additional procedures for revision or removal.  I believe he is well aware these are not all inclusive, and desires to proceed with an embedded CAPD catheter placement for later withdrawal, should the need arise.  Face-to-face time spent with the patient and accompanying care providers(if present) was 45 minutes, with more than 50% of  the time spent counseling, educating, and coordinating care of the patient.    These notes generated with voice recognition software. I apologize for typographical errors.  Elsbeth Yearick M.D., FACS 08/02/2022, 12:23 PM     

## 2022-08-02 NOTE — Progress Notes (Signed)
Patient ID: Luke Gibson, male   DOB: [birth year], 67 y.o.   MRN: [id number]  Chief Complaint:  ESRD followed by Dr. Thedore Mins.   History of Present Illness Luke Gibson is a 67 y.o. male with hypertension, diet-controlled diabetes, obstructive sleep apnea, hypothyroidism and gout.   Hypertensive Chronic kidney disease stage 5 CKD risk factors include diabetes, hypertension, obesity(BMI 37.4) At present, he does not have any uremic symptoms. Patient has had home modalities education. He prefers peritoneal dialysis. Referred to general surgery for evaluation for embedded PD catheter placement. No uremic symptoms at present. Patient has attended kidney disease education class. In September and October 2023.  He has at kidney transplant evaluation at [facility] and is progressing slowly.  GFR has further declined to 8.  Prior surgical history includes umbilical hernia repair, and laparoscopic cholecystectomy.  He has not started or had any hemodialysis at this point.  He consumes a fair amount of water and therefore voids quite frequently.  Past Medical History Past Medical History:  Diagnosis Date   CKD stage 4 due to type 2 diabetes mellitus (HCC)    HLD (hyperlipidemia)    HTN (hypertension)    Hypothyroidism    Neuropathy    OSA on CPAP    T2DM (type 2 diabetes mellitus) (HCC)       Past Surgical History:  Procedure Laterality Date   CHOLECYSTECTOMY     HERNIA REPAIR      Allergies  Allergen Reactions   Amlodipine Other (See Comments)    Peripheral edema    Current Outpatient Medications  Medication Sig Dispense Refill   acetaminophen-codeine (TYLENOL #3) 300-30 MG tablet Take 1 tablet by mouth every 6 (six) hours as needed.     amLODipine (NORVASC) 5 MG tablet Take 5 mg by mouth daily.     aspirin EC 81 MG tablet Take 1 tablet by mouth daily.     Cholecalciferol 25 MCG (1000 UT) capsule Take 1 capsule by mouth every morning.     doxazosin (CARDURA) 4 MG  tablet Take by mouth.     Garlic Oil 1000 MG CAPS Take by mouth.     levothyroxine (SYNTHROID) 137 MCG tablet Take by mouth.     sildenafil (VIAGRA) 100 MG tablet Take by mouth.     sodium bicarbonate 650 MG tablet Take by mouth.     tadalafil, PAH, (ADCIRCA) 20 MG tablet Take 1 tablet by mouth daily as needed.     No current facility-administered medications for this visit.    Family History History reviewed. No pertinent family history.    Social History Social History   Tobacco Use   Smoking status: Former   Smokeless tobacco: Never        Review of Systems  Constitutional: Negative.   HENT:  Positive for tinnitus.   Eyes: Negative.   Cardiovascular: Negative.   Gastrointestinal: Negative.   Genitourinary: Negative.   Skin: Negative.   Neurological: Negative.   Psychiatric/Behavioral: Negative.       Physical Exam Blood pressure (!) 183/83, pulse 79, temperature 97.9 F (36.6 C), temperature source Oral, height 5\' 11"  (1.803 m), weight 254 lb 9.6 oz (115.5 kg), SpO2 99 %. Last Weight  Most recent update: 08/02/2022  9:40 AM    Weight  115.5 kg (254 lb 9.6 oz)             CONSTITUTIONAL: Well developed, and nourished, appropriately responsive and aware without distress.   EYES: Sclera non-icteric.  EARS, NOSE, MOUTH AND THROAT:  The oropharynx is clear. Oral mucosa is pink and moist.     Hearing is intact to voice.  NECK: Trachea is midline, and there is no jugular venous distension.  LYMPH NODES:  Lymph nodes in the neck are not appreciated. RESPIRATORY:  Lungs are clear, and breath sounds are equal bilaterally.  Normal respiratory effort without pathologic use of accessory muscles. CARDIOVASCULAR: Heart is regular in rate and rhythm.   Well perfused.  GI: The abdomen is  soft, nontender, and nondistended.  There is an umbilical scar, with this degree of irregularity present, no appreciable recurrent hernia, there is diastases recti.  There were no palpable  masses.  I did not appreciate hepatosplenomegaly. MUSCULOSKELETAL:  Symmetrical muscle tone appreciated in all four extremities.    SKIN: Skin turgor is normal. No pathologic skin lesions appreciated.  NEUROLOGIC:  Motor and sensation appear grossly normal.  Cranial nerves are grossly without defect. PSYCH:  Alert and oriented to person, place and time. Affect is appropriate for situation.  Data Reviewed I have personally reviewed what is currently available of the patient's imaging, recent labs and medical records.   Labs:      No data to display             No data to display            Imaging: Radiological images reviewed:   Within last 24 hrs: No results found.  Assessment    Chronic kidney disease stage IV Patient Active Problem List   Diagnosis Date Noted   Chronic kidney disease, stage 3 (HCC) 01/17/2022   Obesity (BMI 30-39.9) 01/18/2021   Hypertension 01/18/2021   CKD (chronic kidney disease) stage 4, GFR 15-29 ml/min (HCC) 01/18/2021   Erectile dysfunction 01/18/2021   Type 2 diabetes mellitus with stage 4 chronic kidney disease, without long-term current use of insulin (HCC) 01/18/2021   Essential hypertension 01/18/2021   Acquired hypothyroidism 01/18/2021   OSA on CPAP 01/20/2020   CPAP use counseling 01/20/2020   Hypothyroidism 01/20/2020   BMI 36.0-36.9,adult 01/20/2020   Pure hypercholesterolemia 08/31/2016    Plan    Laparoscopic placement of embedded CAPD catheter.  Possible omentopexy.  We discussed the risks of the above which include but are not limited to anesthesia, bleeding, infection, catheter dysfunction, additional procedures for revision or removal.  I believe he is well aware these are not all inclusive, and desires to proceed with an embedded CAPD catheter placement for later withdrawal, should the need arise.  Face-to-face time spent with the patient and accompanying care providers(if present) was 45 minutes, with more than 50% of  the time spent counseling, educating, and coordinating care of the patient.    These notes generated with voice recognition software. I apologize for typographical errors.  Campbell Lerner M.D., FACS 08/02/2022, 12:23 PM

## 2022-08-02 NOTE — Patient Instructions (Addendum)
You have requested for [place] to place a Peritoneal Dialysis Catheter. This will be scheduled with Dr. Claudine Mouton at [facility].   Please see your (blue)pre-care sheet for information. Our surgery scheduler will call you to verify surgery date and to go over information.   You will need to arrange to be off work for 1-2 weeks but will have to have a lifting restriction of no more than 15 lbs for 4-6 weeks following your surgery. If you have FMLA or disability paperwork that needs filled out you may drop this off at our office or this can be faxed to [phone].    Peritoneal Dialysis Information Dialysis is a procedure that does some of the work healthy kidneys do. Peritoneal dialysis uses the thin lining of the belly (peritoneum) and a fluid called dialysate to remove wastes, salt, and extra water from the blood. Tell a health care provider about: Any allergies you have. All medicines you are taking. This includes vitamins, herbs, eye drops, creams, and over-the-counter medicines. Any problems you or family members have had with anesthetic medicines. Any blood disorders you have. Any surgeries you have. Any medical conditions you have. Whether you are pregnant or may be pregnant. What are the risks? Generally, this treatment is safe. But, problems may occur, including: Infection of the lining of the belly. Infection around the tube (catheter) that was inserted. Pain in the area where the tube was inserted. Weak muscles in the belly area. What happens before the procedure? Take steps to prevent infection. Your doctor will tell you what to do before treatment. You may have to: Put on a mask. Close doors and windows in your room. Wash your hands before and during treatment. Anyone who touches you or the machine must also wash hands often. Make sure that the machine and tubing do not have germs (are sterile). Check the bag of dialysate. Make sure it is sealed and free  of germs. What happens during the procedure?  At the start of each treatment, your belly will be filled with dialysate. The dialysate pulls waste, salt, and water through the lining of the belly. At the end of each treatment, the dialysate, salt, and water will be drained from your body. The treatment will be done using one of these methods: Continuous cycling peritoneal dialysis (CCPD). In this type, a machine fills and drains your belly while you sleep. Continuous ambulatory peritoneal dialysis (CAPD). This type is done up to 5 times a day. Each treatment takes about 30-40 minutes. You may do your normal activities between treatments. In some cases, both CCPD and CAPD are used. What can I expect after the procedure? Lab tests may be done to check how your treatment is working. Change the bandage (dressing) around your tube as told by your doctor. Keep the bandage clean and dry. Weigh yourself after the treatment and write down your weight. Follow these instructions at home: Eating and drinking Follow what your doctor tells you about eating and drinking. Your diet plan should include: Talking with a food expert (dietitian). Vitamin supplements. Good proteins. This includes meat, poultry, fish, and eggs. You may need to eat a high-protein diet. Preventing constipation Take steps to prevent problems when pooping (constipation): Eat foods that are high in fiber. This includes beans, whole grains, and fresh fruits and vegetables. Limit foods that are high in fat and sugars. This includes fried or sweet foods. Be active. Go to the bathroom when you need to. Do not  hold it in. Take medicines to treat this problem only if your doctor tells you to. General instructions Keep a strict schedule. The treatment must be done every day. Do not skip a day or a treatment. Make time for each treatment. Keep all supplies in a cool, clean, and dry place. Take all medicines only as told by your  doctor. Weigh yourself every day. Sudden weight gain may be a sign of a problem. Keep all follow-up visits. Where to find more information [employer] of Diabetes and Digestive and Kidney Diseases: CarFlippers.tn National Kidney Foundation: www.kidney.org Contact a doctor if: You have a fever or chills. You vomit. You feel like you may vomit. You have watery poop (diarrhea). You have problems doing the treatment. Your blood pressure goes up. You gain weight in a short time. You feel short of breath all of a sudden. The tube in your belly seems loose or feels like it is coming out. The fluid from your belly is pinkish or reddish. Women who are having their period do not need to get help if the fluid is only a little pink or a little red. There are white strands in the dialysate. The strands can get stuck in your tubing. Get help right away if: The area around the tube in your belly swells or gets red, tender, or painful. There is pus coming from the area around the tube in your belly. The fluid from your belly is cloudy. You have more belly pain. Summary Peritoneal dialysis does some of the work healthy kidneys do. CAPD and CCPD are the two ways of doing dialysis. Your doctor will help you decide which type is best for you. This information is not intended to replace advice given to you by your health care provider. Make sure you discuss any questions you have with your health care provider. Document Revised: 10/17/2019 Document Reviewed: 10/17/2019 Elsevier Patient Education  2023 [employer].

## 2022-08-02 NOTE — Telephone Encounter (Signed)
Patient calls back, he is informed of all dates regarding his surgery.  

## 2022-08-02 NOTE — Telephone Encounter (Signed)
Left message for patient to call, please inform him of the following regarding scheduled surgery with Dr. Claudine Mouton.   Pre-Admission date/time, and Surgery date at [facility].  Surgery Date: 08/31/22 Preadmission Testing Date: 08/25/22 (phone 8a-1p)  Also patient will need to call at [phone], between 1-3:00pm the day before surgery, to find out what time to arrive for surgery.

## 2022-08-25 ENCOUNTER — Encounter
Admission: RE | Admit: 2022-08-25 | Discharge: 2022-08-25 | Disposition: A | Discharge status: Home / Self Care | Payer: TRICARE For Life (TFL) | Source: Ambulatory Visit | Attending: Surgery | Admitting: Surgery

## 2022-08-25 VITALS — Ht 71.0 in | Wt 253.5 lb

## 2022-08-25 DIAGNOSIS — Z01818 Encounter for other preprocedural examination: Secondary | ICD-10-CM

## 2022-08-25 HISTORY — DX: Chronic kidney disease, stage 4 (severe): N18.4

## 2022-08-25 HISTORY — DX: Obesity, unspecified: E66.9

## 2022-08-25 HISTORY — DX: Male erectile dysfunction, unspecified: N52.9

## 2022-08-25 HISTORY — DX: Personal history of nicotine dependence: Z87.891

## 2022-08-25 NOTE — Patient Instructions (Signed)
Your procedure is scheduled on:08-31-22 Wednesday Report to the Registration Desk on the 1st floor of the Medical Mall.Then proceed to the 2nd floor Surgery Desk To find out your arrival time, please call [phone] between 1PM - 3PM on:08-30-22 Tuesday If your arrival time is 6:00 am, do not arrive before that time as the Medical Mall entrance doors do not open until 6:00 am.  REMEMBER: Instructions that are not followed completely may result in serious medical risk, up to and including death; or upon the discretion of your surgeon and anesthesiologist your surgery may need to be rescheduled.  Do not eat food OR drink any liquids after midnight the night before surgery.  No gum chewing or hard candies.  One week prior to surgery: Stop Anti-inflammatories (NSAIDS) such as Advil, Aleve, Ibuprofen, Motrin, Naproxen, Naprosyn and Aspirin based products such as Excedrin, Goody's Powder, BC Powder.You may however, take Tylenol if needed for pain up until the day of surgery. Stop ANY OVER THE COUNTER supplements/vitamins NOW (08-25-22) until after surgery (Garlic Oil, Vitamin D3)   Continue taking all prescribed medications with the exception of the following: -Last dose of 81 mg Aspirin was on 08-21-22 -Stop sildenafil (VIAGRA) 2 days prior to surgery-Last dose will be on 08-28-22 Sunday  TAKE ONLY THESE MEDICATIONS THE MORNING OF SURGERY WITH A SIP OF WATER: -amLODipine (NORVASC)  -levothyroxine (SYNTHROID)  -sodium bicarbonate   No Alcohol for 24 hours before or after surgery.  No Smoking including e-cigarettes for 24 hours before surgery.  No chewable tobacco products for at least 6 hours before surgery.  No nicotine patches on the day of surgery.  Do not use any "recreational" drugs for at least a week (preferably 2 weeks) before your surgery.  Please be advised that the combination of cocaine and anesthesia may have negative outcomes, up to and including death. If you test positive for  cocaine, your surgery will be cancelled.  On the morning of surgery brush your teeth with toothpaste and water, you may rinse your mouth with mouthwash if you wish. Do not swallow any toothpaste or mouthwash.  Use CHG Soap as directed on instruction sheet.  Do not wear jewelry, make-up, hairpins, clips or nail polish.  Do not wear lotions, powders, or perfumes.   Do not shave body hair from the neck down 48 hours before surgery.  Contact lenses, hearing aids and dentures may not be worn into surgery.  Do not bring valuables to the hospital. [facility] is not responsible for any missing/lost belongings or valuables.   Bring your C-PAP to the hospital   Notify your doctor if there is any change in your medical condition (cold, fever, infection).  Wear comfortable clothing (specific to your surgery type) to the hospital.  After surgery, you can help prevent lung complications by doing breathing exercises.  Take deep breaths and cough every 1-2 hours. Your doctor may order a device called an Incentive Spirometer to help you take deep breaths. When coughing or sneezing, hold a pillow firmly against your incision with both hands. This is called "splinting." Doing this helps protect your incision. It also decreases belly discomfort.  If you are being admitted to the hospital overnight, leave your suitcase in the car. After surgery it may be brought to your room.  In case of increased patient census, it may be necessary for you, the patient, to continue your postoperative care in the Same Day Surgery department.  If you are being discharged the day of  surgery, you will not be allowed to drive home. You will need a responsible individual to drive you home and stay with you for 24 hours after surgery.   If you are taking public transportation, you will need to have a responsible individual with you.  Please call the Pre-admissions Testing Dept. at [phone] if you have any questions  about these instructions.  Surgery Visitation Policy:  Patients having surgery or a procedure may have two visitors.  Children under the age of 77 must have an adult with them who is not the patient.     Preparing for Surgery with CHLORHEXIDINE GLUCONATE (CHG) Soap  Chlorhexidine Gluconate (CHG) Soap  o An antiseptic cleaner that kills germs and bonds with the skin to continue killing germs even after washing  o Used for showering the night before surgery and morning of surgery  Before surgery, you can play an important role by reducing the number of germs on your skin.  CHG (Chlorhexidine gluconate) soap is an antiseptic cleanser which kills germs and bonds with the skin to continue killing germs even after washing.  Please do not use if you have an allergy to CHG or antibacterial soaps. If your skin becomes reddened/irritated stop using the CHG.  1. Shower the NIGHT BEFORE SURGERY and the MORNING OF SURGERY with CHG soap.  2. If you choose to wash your hair, wash your hair first as usual with your normal shampoo.  3. After shampooing, rinse your hair and body thoroughly to remove the shampoo.  4. Use CHG as you would any other liquid soap. You can apply CHG directly to the skin and wash gently with a scrungie or a clean washcloth.  5. Apply the CHG soap to your body only from the neck down. Do not use on open wounds or open sores. Avoid contact with your eyes, ears, mouth, and genitals (private parts). Wash face and genitals (private parts) with your normal soap.  6. Wash thoroughly, paying special attention to the area where your surgery will be performed.  7. Thoroughly rinse your body with warm water.  8. Do not shower/wash with your normal soap after using and rinsing off the CHG soap.  9. Pat yourself dry with a clean towel.  10. Wear clean pajamas to bed the night before surgery.  12. Place clean sheets on your bed the night of your first shower and do not sleep  with pets.  13. Shower again with the CHG soap on the day of surgery prior to arriving at the hospital.  14. Do not apply any deodorants/lotions/powders.  15. Please wear clean clothes to the hospital.

## 2022-08-30 MED ORDER — ORAL CARE MOUTH RINSE: 15.0000 mL | Freq: Once | OROMUCOSAL | Status: AC

## 2022-08-30 MED ORDER — CHLORHEXIDINE GLUCONATE 0.12 % MT SOLN
15.0000 mL | Freq: Once | OROMUCOSAL | Status: AC
  Administered 2022-08-31: 15 mL via OROMUCOSAL

## 2022-08-30 MED ORDER — GABAPENTIN 300 MG PO CAPS
300.0000 mg | ORAL_CAPSULE | ORAL | Status: AC
  Administered 2022-08-31: 300 mg via ORAL

## 2022-08-30 MED ORDER — BUPIVACAINE LIPOSOME 1.3 % IJ SUSP: 20.0000 mL | Freq: Once | INTRAMUSCULAR | Status: DC

## 2022-08-30 MED ORDER — FAMOTIDINE 20 MG PO TABS
20.0000 mg | ORAL_TABLET | Freq: Once | ORAL | Status: AC
  Administered 2022-08-31: 20 mg via ORAL

## 2022-08-30 MED ORDER — CEFAZOLIN SODIUM-DEXTROSE 2-4 GM/100ML-% IV SOLN
2.0000 g | INTRAVENOUS | Status: AC
  Administered 2022-08-31: 2 g via INTRAVENOUS

## 2022-08-30 MED ORDER — SODIUM CHLORIDE 0.9 % IV SOLN: INTRAVENOUS | Status: DC

## 2022-08-30 MED ORDER — CELECOXIB 200 MG PO CAPS
200.0000 mg | ORAL_CAPSULE | ORAL | Status: AC
  Administered 2022-08-31: 200 mg via ORAL

## 2022-08-30 MED ORDER — CHLORHEXIDINE GLUCONATE CLOTH 2 % EX PADS
6.0000 | MEDICATED_PAD | Freq: Once | CUTANEOUS | Status: AC
  Administered 2022-08-31: 6 via TOPICAL

## 2022-08-30 MED ORDER — CHLORHEXIDINE GLUCONATE CLOTH 2 % EX PADS
6.0000 | MEDICATED_PAD | Freq: Once | CUTANEOUS | Status: AC
  Administered 2022-08-30: 6 via TOPICAL

## 2022-08-30 MED ORDER — ACETAMINOPHEN 500 MG PO TABS
1000.0000 mg | ORAL_TABLET | ORAL | Status: AC
  Administered 2022-08-31: 1000 mg via ORAL

## 2022-08-31 ENCOUNTER — Other Ambulatory Visit: Payer: Self-pay

## 2022-08-31 ENCOUNTER — Ambulatory Visit: Payer: Medicare PPO | Admitting: Anesthesiology

## 2022-08-31 ENCOUNTER — Encounter: Payer: Self-pay | Admitting: Surgery

## 2022-08-31 ENCOUNTER — Ambulatory Visit
Admission: RE | Admit: 2022-08-31 | Discharge: 2022-08-31 | Disposition: A | Discharge status: Home / Self Care | Payer: Medicare PPO | Source: Ambulatory Visit | Attending: Surgery | Admitting: Surgery

## 2022-08-31 ENCOUNTER — Encounter
Admission: RE | Disposition: A | Discharge status: Home / Self Care | Payer: Self-pay | Source: Ambulatory Visit | Attending: Surgery

## 2022-08-31 DIAGNOSIS — N184 Chronic kidney disease, stage 4 (severe): Secondary | ICD-10-CM

## 2022-08-31 DIAGNOSIS — E039 Hypothyroidism, unspecified: Secondary | ICD-10-CM | POA: Insufficient documentation

## 2022-08-31 DIAGNOSIS — Z79890 Hormone replacement therapy (postmenopausal): Secondary | ICD-10-CM | POA: Diagnosis not present

## 2022-08-31 DIAGNOSIS — N529 Male erectile dysfunction, unspecified: Secondary | ICD-10-CM | POA: Insufficient documentation

## 2022-08-31 DIAGNOSIS — G4733 Obstructive sleep apnea (adult) (pediatric): Secondary | ICD-10-CM | POA: Diagnosis not present

## 2022-08-31 DIAGNOSIS — E669 Obesity, unspecified: Secondary | ICD-10-CM | POA: Insufficient documentation

## 2022-08-31 DIAGNOSIS — E1122 Type 2 diabetes mellitus with diabetic chronic kidney disease: Secondary | ICD-10-CM | POA: Insufficient documentation

## 2022-08-31 DIAGNOSIS — Z79899 Other long term (current) drug therapy: Secondary | ICD-10-CM | POA: Insufficient documentation

## 2022-08-31 DIAGNOSIS — I129 Hypertensive chronic kidney disease with stage 1 through stage 4 chronic kidney disease, or unspecified chronic kidney disease: Secondary | ICD-10-CM | POA: Diagnosis not present

## 2022-08-31 DIAGNOSIS — N186 End stage renal disease: Secondary | ICD-10-CM | POA: Diagnosis present

## 2022-08-31 DIAGNOSIS — Z87891 Personal history of nicotine dependence: Secondary | ICD-10-CM | POA: Diagnosis not present

## 2022-08-31 DIAGNOSIS — I120 Hypertensive chronic kidney disease with stage 5 chronic kidney disease or end stage renal disease: Secondary | ICD-10-CM | POA: Insufficient documentation

## 2022-08-31 DIAGNOSIS — Z6837 Body mass index (BMI) 37.0-37.9, adult: Secondary | ICD-10-CM | POA: Diagnosis not present

## 2022-08-31 DIAGNOSIS — E785 Hyperlipidemia, unspecified: Secondary | ICD-10-CM | POA: Diagnosis not present

## 2022-08-31 DIAGNOSIS — Z01818 Encounter for other preprocedural examination: Secondary | ICD-10-CM

## 2022-08-31 HISTORY — PX: CAPD INSERTION: SHX5233

## 2022-08-31 LAB — GLUCOSE, CAPILLARY
Glucose-Capillary: 79 mg/dL (ref 70–99)
Glucose-Capillary: 104 mg/dL — ABNORMAL HIGH (ref 70–99)

## 2022-08-31 SURGERY — LAPAROSCOPIC INSERTION CONTINUOUS AMBULATORY PERITONEAL DIALYSIS  (CAPD) CATHETER
Anesthesia: General

## 2022-08-31 MED ORDER — DEXAMETHASONE SODIUM PHOSPHATE 10 MG/ML IJ SOLN
INTRAMUSCULAR | Status: DC | PRN
  Administered 2022-08-31: 10 mg via INTRAVENOUS

## 2022-08-31 MED ORDER — FENTANYL CITRATE (PF) 100 MCG/2ML IJ SOLN
INTRAMUSCULAR | Status: AC
  Filled 2022-08-31: qty 2

## 2022-08-31 MED ORDER — PROPOFOL 1000 MG/100ML IV EMUL
INTRAVENOUS | Status: AC
  Filled 2022-08-31: qty 100

## 2022-08-31 MED ORDER — GABAPENTIN 300 MG PO CAPS
ORAL_CAPSULE | ORAL | Status: AC
  Filled 2022-08-31: qty 1

## 2022-08-31 MED ORDER — PHENYLEPHRINE 80 MCG/ML (10ML) SYRINGE FOR IV PUSH (FOR BLOOD PRESSURE SUPPORT)
PREFILLED_SYRINGE | INTRAVENOUS | Status: DC | PRN
  Administered 2022-08-31: 160 ug via INTRAVENOUS
  Administered 2022-08-31: 80 ug via INTRAVENOUS
  Administered 2022-08-31: 160 ug via INTRAVENOUS
  Administered 2022-08-31: 80 ug via INTRAVENOUS
  Administered 2022-08-31 (×2): 160 ug via INTRAVENOUS

## 2022-08-31 MED ORDER — CEFAZOLIN SODIUM-DEXTROSE 2-4 GM/100ML-% IV SOLN
INTRAVENOUS | Status: AC
  Filled 2022-08-31: qty 100

## 2022-08-31 MED ORDER — BUPIVACAINE HCL (PF) 0.25 % IJ SOLN
INTRAMUSCULAR | Status: AC
  Filled 2022-08-31: qty 30

## 2022-08-31 MED ORDER — BUPIVACAINE-EPINEPHRINE (PF) 0.25% -1:200000 IJ SOLN
INTRAMUSCULAR | Status: DC | PRN
  Administered 2022-08-31: 30 mL

## 2022-08-31 MED ORDER — 0.9 % SODIUM CHLORIDE (POUR BTL) OPTIME
TOPICAL | Status: DC | PRN
  Administered 2022-08-31: 500 mL

## 2022-08-31 MED ORDER — HYDROCODONE-ACETAMINOPHEN 5-325 MG PO TABS
1.0000 | ORAL_TABLET | Freq: Four times a day (QID) | ORAL | 0 refills | Status: DC | PRN
Start: 2022-08-31 — End: 2022-09-13

## 2022-08-31 MED ORDER — ACETAMINOPHEN 500 MG PO TABS
ORAL_TABLET | ORAL | Status: AC
  Filled 2022-08-31: qty 2

## 2022-08-31 MED ORDER — PROPOFOL 10 MG/ML IV BOLUS
INTRAVENOUS | Status: DC | PRN
  Administered 2022-08-31: 200 mg via INTRAVENOUS

## 2022-08-31 MED ORDER — FENTANYL CITRATE (PF) 100 MCG/2ML IJ SOLN: 25.0000 ug | INTRAMUSCULAR | Status: DC | PRN

## 2022-08-31 MED ORDER — MIDAZOLAM HCL 2 MG/2ML IJ SOLN
INTRAMUSCULAR | Status: AC
  Filled 2022-08-31: qty 2

## 2022-08-31 MED ORDER — CHLORHEXIDINE GLUCONATE 0.12 % MT SOLN
OROMUCOSAL | Status: AC
  Filled 2022-08-31: qty 15

## 2022-08-31 MED ORDER — EPHEDRINE SULFATE (PRESSORS) 50 MG/ML IJ SOLN
INTRAMUSCULAR | Status: DC | PRN
  Administered 2022-08-31: 10 mg via INTRAVENOUS

## 2022-08-31 MED ORDER — CELECOXIB 200 MG PO CAPS
ORAL_CAPSULE | ORAL | Status: AC
  Filled 2022-08-31: qty 1

## 2022-08-31 MED ORDER — FAMOTIDINE 20 MG PO TABS
ORAL_TABLET | ORAL | Status: AC
  Filled 2022-08-31: qty 1

## 2022-08-31 MED ORDER — DROPERIDOL 2.5 MG/ML IJ SOLN: 0.6250 mg | Freq: Once | INTRAMUSCULAR | Status: DC | PRN

## 2022-08-31 MED ORDER — ONDANSETRON HCL 4 MG/2ML IJ SOLN
INTRAMUSCULAR | Status: DC | PRN
  Administered 2022-08-31: 4 mg via INTRAVENOUS

## 2022-08-31 MED ORDER — LIDOCAINE HCL (CARDIAC) PF 100 MG/5ML IV SOSY
PREFILLED_SYRINGE | INTRAVENOUS | Status: DC | PRN
  Administered 2022-08-31: 100 mg via INTRAVENOUS

## 2022-08-31 MED ORDER — EPINEPHRINE PF 1 MG/ML IJ SOLN
INTRAMUSCULAR | Status: AC
  Filled 2022-08-31: qty 1

## 2022-08-31 MED ORDER — SUGAMMADEX SODIUM 200 MG/2ML IV SOLN
INTRAVENOUS | Status: DC | PRN
  Administered 2022-08-31: 400 mg via INTRAVENOUS

## 2022-08-31 MED ORDER — SUCCINYLCHOLINE CHLORIDE 200 MG/10ML IV SOSY
PREFILLED_SYRINGE | INTRAVENOUS | Status: DC | PRN
  Administered 2022-08-31: 120 mg via INTRAVENOUS

## 2022-08-31 MED ORDER — GLYCOPYRROLATE 0.2 MG/ML IJ SOLN
INTRAMUSCULAR | Status: DC | PRN
  Administered 2022-08-31: .2 mg via INTRAVENOUS

## 2022-08-31 MED ORDER — ROCURONIUM BROMIDE 100 MG/10ML IV SOLN
INTRAVENOUS | Status: DC | PRN
  Administered 2022-08-31: 10 mg via INTRAVENOUS
  Administered 2022-08-31: 40 mg via INTRAVENOUS

## 2022-08-31 MED ORDER — FENTANYL CITRATE (PF) 100 MCG/2ML IJ SOLN
INTRAMUSCULAR | Status: DC | PRN
  Administered 2022-08-31 (×2): 50 ug via INTRAVENOUS

## 2022-08-31 MED ORDER — MIDAZOLAM HCL 2 MG/2ML IJ SOLN
INTRAMUSCULAR | Status: DC | PRN
  Administered 2022-08-31: 2 mg via INTRAVENOUS

## 2022-08-31 SURGICAL SUPPLY — 47 items
ADAPTER CATH DIALYSIS 4X8 IT L (MISCELLANEOUS) ×1 IMPLANT
ADH SKN CLS APL DERMABOND .7 (GAUZE/BANDAGES/DRESSINGS) ×1
BIOPATCH WHT 1IN DISK W/4.0 H (GAUZE/BANDAGES/DRESSINGS) ×1 IMPLANT
BLADE CLIPPER SURG (BLADE) ×1 IMPLANT
CATH EXTENDED DIALYSIS (CATHETERS) ×1 IMPLANT
DERMABOND ADVANCED .7 DNX12 (GAUZE/BANDAGES/DRESSINGS) ×1 IMPLANT
ELECT CAUTERY BLADE 6.4 (BLADE) ×1 IMPLANT
ELECT REM PT RETURN 9FT ADLT (ELECTROSURGICAL) ×1
ELECTRODE REM PT RTRN 9FT ADLT (ELECTROSURGICAL) ×1 IMPLANT
GLOVE ORTHO TXT STRL SZ7.5 (GLOVE) ×1 IMPLANT
GOWN STRL REUS W/ TWL LRG LVL3 (GOWN DISPOSABLE) ×1 IMPLANT
GOWN STRL REUS W/ TWL XL LVL3 (GOWN DISPOSABLE) ×1 IMPLANT
GOWN STRL REUS W/TWL LRG LVL3 (GOWN DISPOSABLE) ×1
GOWN STRL REUS W/TWL XL LVL3 (GOWN DISPOSABLE) ×1
GRASPER SUT TROCAR 14GX15 (MISCELLANEOUS) IMPLANT
IV NS 1000ML (IV SOLUTION) ×1
IV NS 1000ML BAXH (IV SOLUTION) ×1 IMPLANT
KIT TURNOVER KIT A (KITS) ×1 IMPLANT
MANIFOLD NEPTUNE II (INSTRUMENTS) ×1 IMPLANT
MINICAP W/POVIDONE IODINE SOL (MISCELLANEOUS) ×1 IMPLANT
NDL HYPO 22X1.5 SAFETY MO (MISCELLANEOUS) ×1 IMPLANT
NDL INSUFFLATION 14GA 120MM (NEEDLE) IMPLANT
NEEDLE HYPO 22X1.5 SAFETY MO (MISCELLANEOUS) ×1 IMPLANT
NEEDLE INSUFFLATION 14GA 120MM (NEEDLE) IMPLANT
NS IRRIG 500ML POUR BTL (IV SOLUTION) ×1 IMPLANT
PACK LAP CHOLECYSTECTOMY (MISCELLANEOUS) ×1 IMPLANT
PENCIL SMOKE EVACUATOR (MISCELLANEOUS) ×1 IMPLANT
SET CYSTO W/LG BORE CLAMP LF (SET/KITS/TRAYS/PACK) ×1 IMPLANT
SET TRANSFER 6 W/TWIST CLAMP 5 (SET/KITS/TRAYS/PACK) ×1 IMPLANT
SET TUBE SMOKE EVAC HIGH FLOW (TUBING) ×1 IMPLANT
SLEEVE Z-THREAD 5X100MM (TROCAR) ×2 IMPLANT
SPONGE DRAIN TRACH 4X4 STRL 2S (GAUZE/BANDAGES/DRESSINGS) ×1 IMPLANT
STYLET FALLER (MISCELLANEOUS) IMPLANT
STYLET FALLER MEDIONICS (MISCELLANEOUS) IMPLANT
SUT MNCRL 4-0 (SUTURE) ×1
SUT MNCRL 4-0 27XMFL (SUTURE) ×1
SUT PROLENE 0 CT 2 (SUTURE) ×1 IMPLANT
SUT VIC AB 3-0 SH 27 (SUTURE) ×1
SUT VIC AB 3-0 SH 27X BRD (SUTURE) ×1 IMPLANT
SUT VICRYL 0 TIES 12 18 (SUTURE) ×1 IMPLANT
SUT VICRYL 0 UR6 27IN ABS (SUTURE) IMPLANT
SUTURE MNCRL 4-0 27XMF (SUTURE) ×1 IMPLANT
SYS KII FIOS ACCESS ABD 5X100 (TROCAR)
SYSTEM KII FIOS ACES ABD 5X100 (TROCAR) IMPLANT
TRAP FLUID SMOKE EVACUATOR (MISCELLANEOUS) ×1 IMPLANT
TROCAR Z-THREAD FIOS 5X100MM (TROCAR) ×1 IMPLANT
WATER STERILE IRR 500ML POUR (IV SOLUTION) ×1 IMPLANT

## 2022-08-31 NOTE — Discharge Instructions (Addendum)
AMBULATORY SURGERY  DISCHARGE INSTRUCTIONS   The drugs that you were given will stay in your system until tomorrow so for the next 24 hours you should not:  Drive an automobile Make any legal decisions Drink any alcoholic beverage   You may resume regular meals tomorrow.  Today it is better to start with liquids and gradually work up to solid foods.  You may eat anything you prefer, but it is better to start with liquids, then soup and crackers, and gradually work up to solid foods.   Please notify your doctor immediately if you have any unusual bleeding, trouble breathing, redness and pain at the surgery site, drainage, fever, or pain not relieved by medication.       Please contact your physician with any problems or Same Day Surgery at [phone], Monday through Friday 6 am to 4 pm, or [facility] number at [phone].  

## 2022-08-31 NOTE — Op Note (Signed)
Laparoscopy, continuous ambulatory peritoneal dialysis catheter placement, embeded.  Pre-operative Diagnosis: Chronic renal failure.   Post-operative Diagnosis: same.   Surgeon: Campbell Lerner, M.D., [facility]  Anesthesia: General endotracheal  Findings: Excellent dialysate flow, omentopexy left upper quadrant.  Estimated Blood Loss: 5 mL         Specimens: None          Complications: none              Procedure Details  The patient was seen again in the Holding Room. The benefits, complications, treatment options, and expected outcomes were discussed with the patient. The risks of bleeding, infection, recurrence of symptoms, failure to resolve symptoms, unanticipated injury, prosthetic placement, prosthetic malfunction, prosthetic infection, any of which could require further surgery were reviewed with the patient. The likelihood of improving the patient's symptoms/condition is good.  The patient and/or family concurred with the proposed plan, giving informed consent.  The patient was taken to Operating Room, identified and the procedure verified.    Prior to the induction of general anesthesia, antibiotic prophylaxis was administered. VTE prophylaxis was in place.  General anesthesia was then administered and tolerated well. After the induction, the patient was positioned in the supine position and the abdomen was prepped with Chloraprep and draped in the sterile fashion.  A Time Out was held and the above information confirmed. After local infiltration of quarter percent Marcaine with epinephrine, stab incision was made left upper quadrant.  Just below the costal margin approximately midclavicular line the Veress needle is passed with sensation of the layers to penetrate the abdominal wall and into the peritoneum.  Saline drop test is confirmed peritoneal placement.  Insufflation is initiated with carbon dioxide to pressures of 15 mmHg. An optical 5 mm trocar was placed under direct  visualization in the right abdominal wall.  Evaluation of the omentum was done, it was entirely adhesed to the ventral hernia repair's IPOMesh.   A second trocar was placed under direct visualization in the epigastrium for better visualization of the left side, as the omentum was obstructing the view from the right.  With local infiltration of quarter percent Marcaine with epinephrine a transverse incision was made just cephalad to the umbilical level on the left patient's left rectus.  This allowed the top of the coil to align with the symphysis pubis.  Utilizing a 5 mm applied optical port, I passed the trocar first through the anterior rectus sheath and then tangentially deep to the rectus muscle the maximal distance toward the symphysis prior to penetrating into the peritoneal cavity, all under direct visualization. I then placed the coil of the peritoneal dialysis catheter through this port into the pelvis and advanced the cuff under the anterior rectus sheath.  I then measured the angled catheter for the length to rise and make a lateral turn inferior to the left costal margin, and then to extend laterally and somewhat inferiorly from that point.  These points were marked.  I chose to leave the omentum adherent to the mesh without mobilization.  There were minimal sigmoid adhesions to the left lower quadrant that I do not believe would create an issue with the pelvic catheter, and I felt that lysing these would also add additional risk for catheter dysfunction. I made a counterincision where the catheter would arch/middle cuff would lie, cut the catheters after measuring and secured the catheters using the intraluminal connector with Prolene.   I then tunneled the catheter to the counterincision.  Ensuring the  catheter remained without kinks or twists along the subcutaneous course.  I then tracked it in the same subcutaneous plane laterally parallel to the costal margin for skin penetration with the  trocar alone.  The catheter was then brought out through that site, having enlarged the exit site for later embeding. We then evaluated the inflow of the dialysate and its gravitational drainage.  This was free flowing.   I then tunneled the residual catheter immediately from this point in the subcutaneous space, and brought it out just sufficiently to excise the trocar. Once this was completed we then removed the remaining trochars, along with the CO2, and closed the skin incisions with interrupted and running subcuticulars of 4-0 Monocryl, sealing them all with Dermabond.    Campbell Lerner M.D., [facility]  [facility] 07/28/2020 9:13 AM

## 2022-08-31 NOTE — Anesthesia Preprocedure Evaluation (Signed)
Anesthesia Evaluation  Patient identified by MRN, date of birth, ID band Patient awake    Reviewed: Allergy & Precautions, H&P , NPO status , Patient's Chart, lab work & pertinent test results, reviewed documented beta blocker date and time   History of Anesthesia Complications Negative for: history of anesthetic complications  Airway Mallampati: II  TM Distance: >3 FB Neck ROM: full    Dental  (+) Dental Advidsory Given, Missing, Poor Dentition, Chipped   Pulmonary neg shortness of breath, sleep apnea and Continuous Positive Airway Pressure Ventilation , neg COPD, neg recent URI, former smoker   Pulmonary exam normal breath sounds clear to auscultation       Cardiovascular Exercise Tolerance: Good hypertension, (-) angina (-) Past MI and (-) Cardiac Stents Normal cardiovascular exam(-) dysrhythmias (-) Valvular Problems/Murmurs Rhythm:regular Rate:Normal     Neuro/Psych negative neurological ROS  negative psych ROS   GI/Hepatic negative GI ROS, Neg liver ROS,,,  Endo/Other  neg diabetesHypothyroidism    Renal/GU CRFRenal disease  negative genitourinary   Musculoskeletal   Abdominal   Peds  Hematology negative hematology ROS (+)   Anesthesia Other Findings Past Medical History: No date: Chronic kidney disease (CKD), stage IV (severe) (HCC) No date: ED (erectile dysfunction) No date: Former smoker No date: HLD (hyperlipidemia) No date: HTN (hypertension) No date: Hypothyroidism No date: Neuropathy No date: Obesity No date: OSA on CPAP No date: T2DM (type 2 diabetes mellitus) (HCC)     Comment:  last a1c on 04-04-22 was 5.3   Reproductive/Obstetrics negative OB ROS                             Anesthesia Physical Anesthesia Plan  ASA: 3  Anesthesia Plan: General   Post-op Pain Management:    Induction: Intravenous  PONV Risk Score and Plan: 2 and Ondansetron, Dexamethasone and  Treatment may vary due to age or medical condition  Airway Management Planned: Oral ETT  Additional Equipment:   Intra-op Plan:   Post-operative Plan: Extubation in OR  Informed Consent: I have reviewed the patients History and Physical, chart, labs and discussed the procedure including the risks, benefits and alternatives for the proposed anesthesia with the patient or authorized representative who has indicated his/her understanding and acceptance.     Dental Advisory Given  Plan Discussed with: Anesthesiologist, CRNA and Surgeon  Anesthesia Plan Comments:        Anesthesia Quick Evaluation

## 2022-08-31 NOTE — Transfer of Care (Signed)
Immediate Anesthesia Transfer of Care Note  Patient: Luke Gibson  Procedure(s) Performed: LAPAROSCOPIC INSERTION CONTINUOUS AMBULATORY PERITONEAL DIALYSIS  (CAPD) CATHETER, embedded  Patient Location: PACU  Anesthesia Type:General  Level of Consciousness: awake, drowsy, and patient cooperative  Airway & Oxygen Therapy: Patient Spontanous Breathing and Patient connected to face mask oxygen  Post-op Assessment: Report given to RN and Post -op Vital signs reviewed and stable  Post vital signs: Reviewed and stable  Last Vitals:  Vitals Value Taken Time  BP 100/77 08/31/22 0912  Temp    Pulse 76 08/31/22 0914  Resp 14 08/31/22 0914  SpO2 95 % 08/31/22 0914  Vitals shown include unvalidated device data.  Last Pain:  Vitals:   08/31/22 0615  TempSrc: Temporal  PainSc: 0-No pain         Complications: No notable events documented.

## 2022-08-31 NOTE — Interval H&P Note (Signed)
History and Physical Interval Note:  08/31/2022 7:22 AM  Luke Gibson  has presented today for surgery, with the diagnosis of Chronic kidney disease.  The various methods of treatment have been discussed with the patient and family. After consideration of risks, benefits and other options for treatment, the patient has consented to  Procedure(s): LAPAROSCOPIC INSERTION CONTINUOUS AMBULATORY PERITONEAL DIALYSIS  (CAPD) CATHETER, embedded (N/A) as a surgical intervention.  The patient's history has been reviewed, patient examined, no change in status, stable for surgery.  I have reviewed the patient's chart and labs.  Questions were answered to the patient's satisfaction.     Luke Gibson

## 2022-08-31 NOTE — Anesthesia Procedure Notes (Addendum)
Procedure Name: General with mask airway Date/Time: 08/31/2022 7:36 AM  Performed by: Mohammed Kindle, CRNAPre-anesthesia Checklist: Patient identified, Emergency Drugs available, Suction available and Patient being monitored Patient Re-evaluated:Patient Re-evaluated prior to induction Oxygen Delivery Method: Circle system utilized Preoxygenation: Pre-oxygenation with 100% oxygen Induction Type: IV induction Laryngoscope Size: McGraph and 3 Grade View: Grade I Tube size: 7.0 mm Number of attempts: 1 Airway Equipment and Method: Patient positioned with wedge pillow Placement Confirmation: ETT inserted through vocal cords under direct vision, positive ETCO2, CO2 detector and breath sounds checked- equal and bilateral Secured at: 21 cm Tube secured with: Tape Dental Injury: Teeth and Oropharynx as per pre-operative assessment

## 2022-09-05 NOTE — Anesthesia Postprocedure Evaluation (Signed)
Anesthesia Post Note  Patient: Luke Gibson  Procedure(s) Performed: LAPAROSCOPIC INSERTION CONTINUOUS AMBULATORY PERITONEAL DIALYSIS  (CAPD) CATHETER, embedded  Patient location during evaluation: PACU Anesthesia Type: General Level of consciousness: awake and alert Pain management: pain level controlled Vital Signs Assessment: post-procedure vital signs reviewed and stable Respiratory status: spontaneous breathing, nonlabored ventilation, respiratory function stable and patient connected to nasal cannula oxygen Cardiovascular status: blood pressure returned to baseline and stable Postop Assessment: no apparent nausea or vomiting Anesthetic complications: no   No notable events documented.   Last Vitals:  Vitals:   08/31/22 0945 08/31/22 0952  BP:  134/74  Pulse: 64 72  Resp: 20 18  Temp:    SpO2: 94% 94%    Last Pain:  Vitals:   08/31/22 0935  TempSrc:   PainSc: 0-No pain                 Lenard Simmer

## 2022-09-13 ENCOUNTER — Encounter: Payer: Self-pay | Admitting: Physician Assistant

## 2022-09-13 ENCOUNTER — Ambulatory Visit (INDEPENDENT_AMBULATORY_CARE_PROVIDER_SITE_OTHER): Payer: Medicare PPO | Admitting: Physician Assistant

## 2022-09-13 VITALS — BP 171/83 | HR 76 | Temp 98.1°F | Ht 71.0 in | Wt 255.2 lb

## 2022-09-13 DIAGNOSIS — E1122 Type 2 diabetes mellitus with diabetic chronic kidney disease: Secondary | ICD-10-CM | POA: Diagnosis not present

## 2022-09-13 DIAGNOSIS — N184 Chronic kidney disease, stage 4 (severe): Secondary | ICD-10-CM

## 2022-09-13 DIAGNOSIS — Z09 Encounter for follow-up examination after completed treatment for conditions other than malignant neoplasm: Secondary | ICD-10-CM | POA: Diagnosis not present

## 2022-09-13 DIAGNOSIS — I129 Hypertensive chronic kidney disease with stage 1 through stage 4 chronic kidney disease, or unspecified chronic kidney disease: Secondary | ICD-10-CM

## 2022-09-13 NOTE — Patient Instructions (Signed)

## 2022-09-13 NOTE — Progress Notes (Signed)
[facility] POST-OP OFFICE VISIT  09/13/2022  HPI: Luke Gibson is a 67 y.o. male 13 days s/p laparoscopic insertion of CAPD catheter (EMBEDDED) with Dr Claudine Mouton   He is doing very well Pain for about 2-3 days; stopped pain medications on POD3 No fever, chills nausea, emesis He is having bowel function Incisions well healed He continues to manage CKD without need to initiate PD at this time; follows with nephrology  Vital signs: BP (!) 171/83   Pulse 76   Temp 98.1 F (36.7 C) (Oral)   Ht 5\' 11"  (1.803 m)   Wt 255 lb 3.2 oz (115.8 kg)   SpO2 99%   BMI 35.59 kg/m    Physical Exam: Constitutional: Well appearing Male, NAD Abdomen: Soft, non-tender, non-distended, no rebound/guarding Skin: Laparoscopic incisions are healing well, no erythema or drainage   Assessment/Plan: This is a 67 y.o. male 13 days s/p laparoscopic insertion of CAPD catheter (EMBEDDED) with Dr Claudine Mouton    - Pain control prn  - Reviewed wound care recommendation  - Reviewed lifting restrictions; 4 weeks total  - He can follow up on as needed basis; He understands to call with questions/concerns  -- Lynden Oxford, PA-C [facility] 09/13/2022, 2:50 PM M-F: 7am - 4pm

## 2023-01-03 ENCOUNTER — Encounter: Payer: Self-pay | Admitting: Surgery

## 2023-01-03 ENCOUNTER — Ambulatory Visit (INDEPENDENT_AMBULATORY_CARE_PROVIDER_SITE_OTHER): Payer: Medicare PPO | Admitting: Surgery

## 2023-01-03 VITALS — BP 146/67 | HR 72 | Temp 98.0°F | Ht 71.0 in | Wt 252.0 lb

## 2023-01-03 DIAGNOSIS — Z09 Encounter for follow-up examination after completed treatment for conditions other than malignant neoplasm: Secondary | ICD-10-CM

## 2023-01-03 DIAGNOSIS — N186 End stage renal disease: Secondary | ICD-10-CM | POA: Diagnosis not present

## 2023-01-03 DIAGNOSIS — Z4902 Encounter for fitting and adjustment of peritoneal dialysis catheter: Secondary | ICD-10-CM | POA: Diagnosis not present

## 2023-01-03 NOTE — Progress Notes (Signed)
Patient ID: Luke Gibson, male   DOB: [birth year], 67 y.o.   MRN: [id number]  Chief Complaint:  Embedded CAPD catheter.   History of Present Illness Luke Gibson is a 67 y.o. male with an embedded CAPD catheter placed last summer.  Appears times approaching where it may become of use to begin 3 times weekly peritoneal dialysis.  Past Medical History Past Medical History:  Diagnosis Date   Chronic kidney disease (CKD), stage IV (severe) (HCC)    ED (erectile dysfunction)    Former smoker    HLD (hyperlipidemia)    HTN (hypertension)    Hypothyroidism    Neuropathy    Obesity    OSA on CPAP    T2DM (type 2 diabetes mellitus) (HCC)    last a1c on 04-04-22 was 5.3      Past Surgical History:  Procedure Laterality Date   CAPD INSERTION N/A 08/31/2022   Procedure: LAPAROSCOPIC INSERTION CONTINUOUS AMBULATORY PERITONEAL DIALYSIS  (CAPD) CATHETER, embedded;  Surgeon: Campbell Lerner, MD;  Location: [facility] ORS;  Service: General;  Laterality: N/A;   CHOLECYSTECTOMY     COLONOSCOPY     HERNIA REPAIR      No Known Allergies  Current Outpatient Medications  Medication Sig Dispense Refill   amLODipine (NORVASC) 10 MG tablet Take 10 mg by mouth daily.     aspirin EC 81 MG tablet Take 1 tablet by mouth daily.     calcitRIOL (ROCALTROL) 0.25 MCG capsule Take 0.25 mcg by mouth daily.     Cholecalciferol 25 MCG (1000 UT) capsule Take 1 capsule by mouth every morning.     doxazosin (CARDURA) 4 MG tablet Take 4 mg by mouth at bedtime.     Garlic Oil 1000 MG CAPS Take 1 capsule by mouth daily.     levothyroxine (SYNTHROID) 125 MCG tablet Take 125 mcg by mouth daily before breakfast.     sildenafil (VIAGRA) 100 MG tablet Take 100 mg by mouth as needed for erectile dysfunction.     sodium bicarbonate 650 MG tablet Take 1,300 mg by mouth 2 (two) times daily.     No current facility-administered medications for this visit.    Family History No family history on file.    Social  History Social History   Tobacco Use   Smoking status: Former    Current packs/day: 0.00    Types: Cigarettes    Quit date: 2019    Years since quitting: 5.8    Passive exposure: Past   Smokeless tobacco: Never  Vaping Use   Vaping status: Former  Substance Use Topics   Alcohol use: Yes    Comment: rare   Drug use: Never          Physical Exam Blood pressure (!) 146/67, pulse 72, temperature 98 F (36.7 C), height 5\' 11"  (1.803 m), weight 252 lb (114.3 kg), SpO2 97%. Last Weight  Most recent update: 01/03/2023  8:58 AM    Weight  114.3 kg (252 lb)             CONSTITUTIONAL: Well developed, and nourished, appropriately responsive and aware without distress.   EYES: Sclera non-icteric.   EARS, NOSE, MOUTH AND THROAT:  The oropharynx is clear. Oral mucosa is pink and moist.   Hearing is intact to voice.  NECK: Trachea is midline, and there is no jugular venous distension.  LYMPH NODES:  Lymph nodes in the neck are not appreciated. RESPIRATORY:  Lungs are clear, and breath sounds are  equal bilaterally.  Normal respiratory effort without pathologic use of accessory muscles. CARDIOVASCULAR: Heart is regular in rate and rhythm.   Well perfused.  GI: The abdomen is soft, nontender, and nondistended. There were no palpable masses.  I did palpate the perceived end of the CAPD catheter, and noting the adjacent incisions for its placement.  MUSCULOSKELETAL:  Symmetrical muscle tone appreciated in all four extremities.    SKIN: Skin turgor is normal. No pathologic skin lesions appreciated.  NEUROLOGIC:  Motor and sensation appear grossly normal.  Cranial nerves are grossly without defect. PSYCH:  Alert and oriented to person, place and time. Affect is appropriate for situation.  Data Reviewed I have personally reviewed what is currently available of the patient's imaging, recent labs and medical records.   Labs:      No data to display             No data to display             Imaging: Radiological images reviewed:   Within last 24 hrs: No results found.  Assessment    Embedded CAPD catheter, desiring to proceed with peritoneal dialysis. Patient Active Problem List   Diagnosis Date Noted   Chronic kidney disease, stage 3 (HCC) 01/17/2022   Obesity (BMI 30-39.9) 01/18/2021   Hypertension 01/18/2021   CKD (chronic kidney disease) stage 4, GFR 15-29 ml/min (HCC) 01/18/2021   Erectile dysfunction 01/18/2021   Type 2 diabetes mellitus with stage 4 chronic kidney disease, without long-term current use of insulin (HCC) 01/18/2021   Essential hypertension 01/18/2021   Acquired hypothyroidism 01/18/2021   OSA on CPAP 01/20/2020   CPAP use counseling 01/20/2020   Hypothyroidism 01/20/2020   BMI 36.0-36.9,adult 01/20/2020   Pure hypercholesterolemia 08/31/2016    Plan    Will proceed with exteriorization of distal end of CAPD catheter, would require purely local anesthetic, and may be done either in the OR in the office.  If we can obtain the equipment/connectors necessary we will proceed with office procedure next week, or we will schedule him for the OR.   Face-to-face time spent with the patient and accompanying care providers(if present) was 15 minutes, with more than 50% of the time spent counseling, educating, and coordinating care of the patient.    These notes generated with voice recognition software. I apologize for typographical errors.  Campbell Lerner M.D., FACS 01/03/2023, 1:00 PM

## 2023-01-05 ENCOUNTER — Ambulatory Visit: Payer: Medicare PPO | Admitting: Surgery

## 2023-01-12 ENCOUNTER — Encounter: Payer: Self-pay | Admitting: Surgery

## 2023-01-12 ENCOUNTER — Ambulatory Visit: Payer: Medicare PPO | Admitting: Surgery

## 2023-01-12 VITALS — BP 153/74 | HR 71 | Temp 98.0°F | Ht 71.0 in | Wt 252.0 lb

## 2023-01-12 DIAGNOSIS — Z992 Dependence on renal dialysis: Secondary | ICD-10-CM | POA: Diagnosis not present

## 2023-01-12 DIAGNOSIS — N186 End stage renal disease: Secondary | ICD-10-CM | POA: Insufficient documentation

## 2023-01-12 NOTE — Progress Notes (Signed)
Exteriorization of embedded CAPD catheter ([id number]).  Pre-operative Diagnosis: End-stage renal disease, embedded CAPD catheter.  Post-operative Diagnosis: same.    Surgeon: Campbell Lerner, M.D., FACS  Anesthesia: Local  Findings: Free flow of peritoneal fluid from catheter tip on exteriorization.  Estimated Blood Loss: 1 mL         Specimens: None          Complications: none              Procedure Details  The patient was seen again in the Procedure Room. The benefits, complications, treatment options, and expected outcomes were discussed with the patient. The risks of bleeding, infection, recurrence of symptoms, failure to resolve symptoms, unanticipated injury, prosthetic dysfunction, prosthetic infection, any of which could require further surgery were reviewed with the patient. The likelihood of improving the patient's condition with maintenance of their baseline status is good.  The patient and/or family concurred with the proposed plan, giving informed consent.  The patient was identified and the procedure verified.    The patient was positioned in the supine position and the left lower quadrant abdomen was prepped with  Chloraprep and draped in the sterile fashion.  A Time Out was held and the above information confirmed. Local infiltration of 1% lidocaine is instilled into the skin at the scar site from the initial transition in the left lower quadrant.  An 7 mm incision is made and sharp and blunt dissection is made to isolate the catheter from the fibrinous sheath.  Catheter was subsequently mobilized and withdrawn from its distal extent without difficulty. The locking titanium adapter was then applied to the end of the catheter.    The mini Extended life PD transfer set was applied to the titanium adapter, along with the mini With povidone iodine solution. A double dressing was applied after placement of a bio disc, 1 securing the wound, excluding it from the coiled catheter  which was placed at top of that dressing and also secured with an ABD dressing. Patient is to present for PD flush tomorrow at the dialysis center.   Campbell Lerner M.D., [facility] 01/12/2023 9:27 AM

## 2023-01-12 NOTE — Patient Instructions (Addendum)
Leave your dressing in place today. The Dialysis center will remove this for you tomorrow.   Do not shower.  Let us know if you have any chills, fever, increased pain at the incision site, or any drainage at the site.     Follow-up with our office as needed.  Please call and ask to speak with a nurse if you develop questions or concerns.

## 2023-01-13 NOTE — Progress Notes (Unsigned)
[facility] [address] [place], [place] [address]  Pulmonary Sleep Medicine   Office Visit Note  Patient Name: Luke MILLETT DOB: 01/18/1956 MRN [id number]    Chief Complaint: Obstructive Sleep Apnea visit  Brief History:  Demarquez is seen today for an annual follow up visit for CPAP@ 10 cmH2O. The patient has a 16 year history of sleep apnea. Patient is using PAP nightly.  The patient feels rested after sleeping with PAP.  The patient reports benefiting from PAP use. Reported sleepiness is  improved and the Epworth Sleepiness Score is 2 out of 24. The patient does not take naps. The patient complains of the following: no complaints with therapy.   The compliance download shows 100% compliance with an average use time of 7 hours 28 minutes. The AHI is 0.2  The patient does not complain of limb movements disrupting sleep. The patient continues to require PAP therapy in order to eliminate sleep apnea.   ROS  General: (-) fever, (-) chills, (-) night sweat Nose and Sinuses: (-) nasal stuffiness or itchiness, (-) postnasal drip, (-) nosebleeds, (-) sinus trouble. Mouth and Throat: (-) sore throat, (-) hoarseness. Neck: (-) swollen glands, (-) enlarged thyroid, (-) neck pain. Respiratory: - cough, - shortness of breath, - wheezing. Neurologic: - numbness, - tingling. Psychiatric: - anxiety, - depression   Current Medication: Outpatient Encounter Medications as of 01/16/2023  Medication Sig   amLODipine (NORVASC) 10 MG tablet Take 10 mg by mouth daily.   aspirin EC 81 MG tablet Take 1 tablet by mouth daily.   calcitRIOL (ROCALTROL) 0.25 MCG capsule Take 0.25 mcg by mouth daily.   calcium acetate (PHOSLO) 667 MG capsule Take 1,334 mg by mouth 3 (three) times daily.   Cholecalciferol 25 MCG (1000 UT) capsule Take 1 capsule by mouth every morning.   doxazosin (CARDURA) 4 MG tablet Take 4 mg by mouth at bedtime.   Garlic Oil 1000 MG CAPS Take 1 capsule by mouth daily.    levothyroxine (SYNTHROID) 125 MCG tablet Take 125 mcg by mouth daily before breakfast.   sildenafil (VIAGRA) 100 MG tablet Take 100 mg by mouth as needed for erectile dysfunction.   sodium bicarbonate 650 MG tablet Take 1,300 mg by mouth 2 (two) times daily.   No facility-administered encounter medications on file as of 01/16/2023.    Surgical History: Past Surgical History:  Procedure Laterality Date   CAPD INSERTION N/A 08/31/2022   Procedure: LAPAROSCOPIC INSERTION CONTINUOUS AMBULATORY PERITONEAL DIALYSIS  (CAPD) CATHETER, embedded;  Surgeon: Campbell Lerner, MD;  Location: [facility] ORS;  Service: General;  Laterality: N/A;   CHOLECYSTECTOMY     COLONOSCOPY     HERNIA REPAIR      Medical History: Past Medical History:  Diagnosis Date   Chronic kidney disease (CKD), stage IV (severe) (HCC)    ED (erectile dysfunction)    Former smoker    HLD (hyperlipidemia)    HTN (hypertension)    Hypothyroidism    Neuropathy    Obesity    OSA on CPAP    T2DM (type 2 diabetes mellitus) (HCC)    last a1c on 04-04-22 was 5.3    Family History: Non contributory to the present illness  Social History: Social History   Socioeconomic History   Marital status: Widowed    Spouse name: Not on file   Number of children: Not on file   Years of education: Not on file   Highest education level: Not on file  Occupational  History   Not on file  Tobacco Use   Smoking status: Former    Current packs/day: 0.00    Types: Cigarettes    Quit date: 2019    Years since quitting: 5.8    Passive exposure: Past   Smokeless tobacco: Never  Vaping Use   Vaping status: Former  Substance and Sexual Activity   Alcohol use: Yes    Comment: rare   Drug use: Never   Sexual activity: Not on file  Other Topics Concern   Not on file  Social History Narrative   Not on file   Social Determinants of Health   Financial Resource Strain: Not on file  Food Insecurity: Not on file  Transportation Needs:  Not on file  Physical Activity: Not on file  Stress: Not on file  Social Connections: Not on file  Intimate Partner Violence: Not on file    Vital Signs: Blood pressure (!) 166/89, pulse 75, resp. rate (!) 24, height 5\' 11"  (1.803 m), weight 257 lb (116.6 kg), SpO2 98%. Body mass index is 35.84 kg/m.    Examination: General Appearance: The patient is well-developed, well-nourished, and in no distress. Neck Circumference: 43 cm Skin: Gross inspection of skin unremarkable. Head: normocephalic, no gross deformities. Eyes: no gross deformities noted. ENT: ears appear grossly normal Neurologic: Alert and oriented. No involuntary movements.  STOP BANG RISK ASSESSMENT S (snore) Have you been told that you snore?     No   T (tired) Are you often tired, fatigued, or sleepy during the day?   No  O (obstruction) Do you stop breathing, choke, or gasp during sleep? No   P (pressure) Do you have or are you being treated for high blood pressure? Yes   B (BMI) Is your body index greater than 35 kg/m? NO   A (age) Are you 68 years old or older? YES   N (neck) Do you have a neck circumference greater than 16 inches?   YES   G (gender) Are you a male? YES   TOTAL STOP/BANG "YES" ANSWERS 4       A STOP-Bang score of 2 or less is considered low risk, and a score of 5 or more is high risk for having either moderate or severe OSA. For people who score 3 or 4, doctors may need to perform further assessment to determine how likely they are to have OSA.         EPWORTH SLEEPINESS SCALE:  Scale:  (0)= no chance of dozing; (1)= slight chance of dozing; (2)= moderate chance of dozing; (3)= high chance of dozing  Chance  Situtation    Sitting and reading: 0    Watching TV: 1    Sitting Inactive in public: 0    As a passenger in car: 0      Lying down to rest: 1    Sitting and talking: 0    Sitting quielty after lunch: 0    In a car, stopped in traffic: 0   TOTAL SCORE:   2 out  of 24    SLEEP STUDIES:  Split Study (10/2006) AHI 114/hr, min SpO2 81%, CPAP@ 10 cmH2O   CPAP COMPLIANCE DATA:  Date Range: 01/13/2022-01/12/2023  Average Daily Use: 7 hours 28 minutes  Median Use: 7 hours 33 minutes  Compliance for > 4 Hours: 100%  AHI: 0.2 respiratory events per hour  Days Used: 365/365 days  Mask Leak: 3  95th Percentile Pressure: 10  LABS: No results found for this or any previous visit (from the past 2160 hour(s)).  Radiology: No results found.  No results found.  No results found.    Assessment and Plan: Patient Active Problem List   Diagnosis Date Noted   Tobacco use disorder 01/16/2023   ESRD (end stage renal disease) (HCC) 01/12/2023   Hyperparathyroidism due to renal insufficiency (HCC) 04/12/2022   Chronic kidney disease, stage 3 (HCC) 01/17/2022   Obesity (BMI 30-39.9) 01/18/2021   Hypertension 01/18/2021   CKD (chronic kidney disease) stage 4, GFR 15-29 ml/min (HCC) 01/18/2021   Erectile dysfunction 01/18/2021   Type 2 diabetes mellitus with stage 4 chronic kidney disease, without long-term current use of insulin (HCC) 01/18/2021   Essential hypertension 01/18/2021   Acquired hypothyroidism 01/18/2021   Type 2 diabetes mellitus with stage 5 chronic kidney disease not on chronic dialysis, without long-term current use of insulin (HCC) 01/18/2021   OSA on CPAP 01/20/2020   CPAP use counseling 01/20/2020   Hypothyroidism 01/20/2020   BMI 36.0-36.9,adult 01/20/2020   Pure hypercholesterolemia 08/31/2016   1. OSA on CPAP The patient does tolerate PAP and reports  benefit from PAP use. He can't sleep without it. He is starting dialysis and is on the transplant list. The patient was reminded how to clean equipment and advised to replace supplies routinely. The patient was also counselled on weight loss. The compliance is excellent. The AHI is 0.2.   OSA on cpap- controlled. Continue with excellent compliance with  pap. CPAP continues to be medically necessary to treat this patient's OSA. F/u one year.     2. CPAP use counseling CPAP Counseling: had a lengthy discussion with the patient regarding the importance of PAP therapy in management of the sleep apnea. Patient appears to understand the risk factor reduction and also understands the risks associated with untreated sleep apnea. Patient will try to make a good faith effort to remain compliant with therapy. Also instructed the patient on proper cleaning of the device including the water must be changed daily if possible and use of distilled water is preferred. Patient understands that the machine should be regularly cleaned with appropriate recommended cleaning solutions that do not damage the PAP machine for example given white vinegar and water rinses. Other methods such as ozone treatment may not be as good as these simple methods to achieve cleaning.   3. Obesity (BMI 30-39.9) Obesity Counseling: Had a lengthy discussion regarding patients BMI and weight issues. Patient was instructed on portion control as well as increased activity. Also discussed caloric restrictions with trying to maintain intake less than 2000 Kcal. Discussions were made in accordance with the 5As of weight management. Simple actions such as not eating late and if able to, taking a walk is suggested.   4. Hypertension, unspecified type Hypertension Counseling:   The following hypertensive lifestyle modification were recommended and discussed:  1. Limiting alcohol intake to less than 1 oz/day of ethanol:(24 oz of beer or 8 oz of wine or 2 oz of 100-proof whiskey). 2. Take baby ASA 81 mg daily. 3. Importance of regular aerobic exercise and losing weight. 4. Reduce dietary saturated fat and cholesterol intake for overall cardiovascular health. 5. Maintaining adequate dietary potassium, calcium, and magnesium intake. 6. Regular monitoring of the blood pressure. 7. Reduce sodium intake  to less than 100 mmol/day (less than 2.3 gm of sodium or less than 6 gm of sodium choride)       General Counseling: I have  discussed the findings of the evaluation and examination with Casimiro Needle.  I have also discussed any further diagnostic evaluation thatmay be needed or ordered today. Evangelos verbalizes understanding of the findings of todays visit. We also reviewed his medications today and discussed drug interactions and side effects including but not limited excessive drowsiness and altered mental states. We also discussed that there is always a risk not just to him but also people around him. he has been encouraged to call the office with any questions or concerns that should arise related to todays visit.  No orders of the defined types were placed in this encounter.       I have personally obtained a history, examined the patient, evaluated laboratory and imaging results, formulated the assessment and plan and placed orders. This patient was seen today by Emmaline Kluver, PA-C in collaboration with Dr. Freda Munro.   Yevonne Pax, MD [facility] Diplomate ABMS Pulmonary Critical Care Medicine and Sleep Medicine

## 2023-01-16 ENCOUNTER — Ambulatory Visit (INDEPENDENT_AMBULATORY_CARE_PROVIDER_SITE_OTHER): Payer: Medicare PPO | Admitting: Internal Medicine

## 2023-01-16 VITALS — BP 166/89 | HR 75 | Resp 24 | Ht 71.0 in | Wt 257.0 lb

## 2023-01-16 DIAGNOSIS — Z7189 Other specified counseling: Secondary | ICD-10-CM

## 2023-01-16 DIAGNOSIS — G4733 Obstructive sleep apnea (adult) (pediatric): Secondary | ICD-10-CM

## 2023-01-16 DIAGNOSIS — I10 Essential (primary) hypertension: Secondary | ICD-10-CM | POA: Diagnosis not present

## 2023-01-16 DIAGNOSIS — E669 Obesity, unspecified: Secondary | ICD-10-CM | POA: Diagnosis not present

## 2023-01-16 DIAGNOSIS — F17200 Nicotine dependence, unspecified, uncomplicated: Secondary | ICD-10-CM | POA: Insufficient documentation

## 2023-01-16 NOTE — Patient Instructions (Signed)

## 2023-07-13 ENCOUNTER — Encounter: Payer: Self-pay | Admitting: Gastroenterology

## 2023-07-19 ENCOUNTER — Encounter: Payer: Self-pay | Admitting: Gastroenterology

## 2023-07-20 ENCOUNTER — Other Ambulatory Visit: Payer: Self-pay

## 2023-07-20 ENCOUNTER — Encounter: Payer: Self-pay | Admitting: Gastroenterology

## 2023-07-20 ENCOUNTER — Encounter
Admission: RE | Disposition: A | Discharge status: Home / Self Care | Payer: Self-pay | Source: Home / Self Care | Attending: Gastroenterology

## 2023-07-20 ENCOUNTER — Ambulatory Visit
Admission: RE | Admit: 2023-07-20 | Discharge: 2023-07-20 | Disposition: A | Discharge status: Home / Self Care | Attending: Gastroenterology | Admitting: Gastroenterology

## 2023-07-20 ENCOUNTER — Ambulatory Visit: Admitting: Certified Registered"

## 2023-07-20 DIAGNOSIS — Z79899 Other long term (current) drug therapy: Secondary | ICD-10-CM | POA: Insufficient documentation

## 2023-07-20 DIAGNOSIS — Z1211 Encounter for screening for malignant neoplasm of colon: Secondary | ICD-10-CM | POA: Diagnosis not present

## 2023-07-20 DIAGNOSIS — K640 First degree hemorrhoids: Secondary | ICD-10-CM | POA: Diagnosis not present

## 2023-07-20 DIAGNOSIS — D124 Benign neoplasm of descending colon: Secondary | ICD-10-CM | POA: Insufficient documentation

## 2023-07-20 DIAGNOSIS — Z87891 Personal history of nicotine dependence: Secondary | ICD-10-CM | POA: Diagnosis not present

## 2023-07-20 DIAGNOSIS — E1122 Type 2 diabetes mellitus with diabetic chronic kidney disease: Secondary | ICD-10-CM | POA: Diagnosis not present

## 2023-07-20 DIAGNOSIS — K5730 Diverticulosis of large intestine without perforation or abscess without bleeding: Secondary | ICD-10-CM | POA: Insufficient documentation

## 2023-07-20 DIAGNOSIS — Z9049 Acquired absence of other specified parts of digestive tract: Secondary | ICD-10-CM | POA: Insufficient documentation

## 2023-07-20 DIAGNOSIS — I120 Hypertensive chronic kidney disease with stage 5 chronic kidney disease or end stage renal disease: Secondary | ICD-10-CM | POA: Diagnosis not present

## 2023-07-20 DIAGNOSIS — G4733 Obstructive sleep apnea (adult) (pediatric): Secondary | ICD-10-CM | POA: Insufficient documentation

## 2023-07-20 DIAGNOSIS — D122 Benign neoplasm of ascending colon: Secondary | ICD-10-CM | POA: Insufficient documentation

## 2023-07-20 DIAGNOSIS — Z992 Dependence on renal dialysis: Secondary | ICD-10-CM | POA: Diagnosis not present

## 2023-07-20 DIAGNOSIS — E039 Hypothyroidism, unspecified: Secondary | ICD-10-CM | POA: Diagnosis not present

## 2023-07-20 DIAGNOSIS — D123 Benign neoplasm of transverse colon: Secondary | ICD-10-CM | POA: Diagnosis not present

## 2023-07-20 DIAGNOSIS — Z860100 Personal history of colon polyps, unspecified: Secondary | ICD-10-CM | POA: Diagnosis present

## 2023-07-20 DIAGNOSIS — N186 End stage renal disease: Secondary | ICD-10-CM | POA: Insufficient documentation

## 2023-07-20 HISTORY — DX: Chronic metabolic acidosis: E87.22

## 2023-07-20 HISTORY — PX: COLONOSCOPY: SHX5424

## 2023-07-20 HISTORY — DX: Secondary hyperparathyroidism of renal origin: N25.81

## 2023-07-20 HISTORY — PX: POLYPECTOMY: SHX149

## 2023-07-20 HISTORY — PX: HEMOSTASIS CLIP PLACEMENT: SHX6857

## 2023-07-20 LAB — POCT I-STAT, CHEM 8
BUN: 81 mg/dL — ABNORMAL HIGH (ref 8–23)
Calcium, Ion: 1.11 mmol/L — ABNORMAL LOW (ref 1.15–1.40)
Chloride: 101 mmol/L (ref 98–111)
Creatinine, Ser: 14.8 mg/dL — ABNORMAL HIGH (ref 0.61–1.24)
Glucose, Bld: 76 mg/dL (ref 70–99)
HCT: 29 % — ABNORMAL LOW (ref 39.0–52.0)
Hemoglobin: 9.9 g/dL — ABNORMAL LOW (ref 13.0–17.0)
Potassium: 3.6 mmol/L (ref 3.5–5.1)
Sodium: 138 mmol/L (ref 135–145)
TCO2: 23 mmol/L (ref 22–32)

## 2023-07-20 SURGERY — COLONOSCOPY
Anesthesia: General

## 2023-07-20 MED ORDER — PROPOFOL 500 MG/50ML IV EMUL
INTRAVENOUS | Status: DC | PRN
  Administered 2023-07-20: 165 ug/kg/min via INTRAVENOUS

## 2023-07-20 MED ORDER — GLYCOPYRROLATE 0.2 MG/ML IJ SOLN
INTRAMUSCULAR | Status: DC | PRN
  Administered 2023-07-20: .2 mg via INTRAVENOUS

## 2023-07-20 MED ORDER — LIDOCAINE HCL (CARDIAC) PF 100 MG/5ML IV SOSY
PREFILLED_SYRINGE | INTRAVENOUS | Status: DC | PRN
  Administered 2023-07-20: 100 mg via INTRAVENOUS

## 2023-07-20 MED ORDER — SODIUM CHLORIDE 0.9 % IV SOLN: INTRAVENOUS | Status: DC

## 2023-07-20 MED ORDER — IPRATROPIUM-ALBUTEROL 0.5-2.5 (3) MG/3ML IN SOLN
3.0000 mL | Freq: Once | RESPIRATORY_TRACT | Status: AC
  Administered 2023-07-20: 3 mL via RESPIRATORY_TRACT

## 2023-07-20 MED ORDER — IPRATROPIUM-ALBUTEROL 0.5-2.5 (3) MG/3ML IN SOLN
RESPIRATORY_TRACT | Status: AC
Start: 2023-07-20 — End: ?
  Filled 2023-07-20: qty 3

## 2023-07-20 MED ORDER — STERILE WATER FOR IRRIGATION IR SOLN
Status: DC | PRN
  Administered 2023-07-20: 180 mL

## 2023-07-20 MED ORDER — PROPOFOL 10 MG/ML IV BOLUS
INTRAVENOUS | Status: DC | PRN
  Administered 2023-07-20: 50 mg via INTRAVENOUS

## 2023-07-20 NOTE — Anesthesia Procedure Notes (Signed)
 Procedure Name: General with mask airway Date/Time: 07/20/2023 11:01 AM  Performed by: Niki Barter, CRNAPre-anesthesia Checklist: Patient identified, Emergency Drugs available, Suction available and Patient being monitored Patient Re-evaluated:Patient Re-evaluated prior to induction Oxygen Delivery Method: Simple face mask Induction Type: IV induction Placement Confirmation: positive ETCO2 and breath sounds checked- equal and bilateral Dental Injury: Teeth and Oropharynx as per pre-operative assessment

## 2023-07-20 NOTE — Anesthesia Preprocedure Evaluation (Addendum)
 Anesthesia Evaluation  Patient identified by MRN, date of birth, ID band Patient awake    Reviewed: Allergy & Precautions, H&P , NPO status , Patient's Chart, lab work & pertinent test results, reviewed documented beta blocker date and time   Airway Mallampati: II   Neck ROM: full    Dental  (+) Poor Dentition   Pulmonary shortness of breath and with exertion, sleep apnea , former smoker   Pulmonary exam normal        Cardiovascular Exercise Tolerance: Poor hypertension, On Medications (-) Orthopnea, (-) PND and (-) DOE Normal cardiovascular exam Rhythm:regular Rate:Normal     Neuro/Psych negative neurological ROS  negative psych ROS   GI/Hepatic negative GI ROS, Neg liver ROS,,,  Endo/Other  diabetesHypothyroidism    Renal/GU Renal disease  negative genitourinary   Musculoskeletal   Abdominal   Peds  Hematology negative hematology ROS (+)   Anesthesia Other Findings Past Medical History: No date: Chronic kidney disease (CKD), stage IV (severe) (HCC) No date: Chronic metabolic acidosis No date: ED (erectile dysfunction) No date: Former smoker No date: HLD (hyperlipidemia) No date: HTN (hypertension) No date: Hyperparathyroidism due to renal insufficiency (HCC) No date: Hypothyroidism No date: Neuropathy No date: Obesity No date: OSA on CPAP No date: T2DM (type 2 diabetes mellitus) (HCC)     Comment:  last a1c on 04-04-22 was 5.3 Past Surgical History: 08/31/2022: CAPD INSERTION; N/A     Comment:  Procedure: LAPAROSCOPIC INSERTION CONTINUOUS AMBULATORY               PERITONEAL DIALYSIS  (CAPD) CATHETER, embedded;  Surgeon:              Flynn Hylan, MD;  Location: [facility] ORS;  Service:               General;  Laterality: N/A; No date: CHOLECYSTECTOMY No date: COLONOSCOPY No date: HERNIA REPAIR BMI    Body Mass Index: 32.08 kg/m     Reproductive/Obstetrics negative OB ROS                               Anesthesia Physical Anesthesia Plan  ASA: 3  Anesthesia Plan: General   Post-op Pain Management:    Induction:   PONV Risk Score and Plan:   Airway Management Planned:   Additional Equipment:   Intra-op Plan:   Post-operative Plan:   Informed Consent: I have reviewed the patients History and Physical, chart, labs and discussed the procedure including the risks, benefits and alternatives for the proposed anesthesia with the patient or authorized representative who has indicated his/her understanding and acceptance.     Dental Advisory Given  Plan Discussed with: CRNA  Anesthesia Plan Comments:         Anesthesia Quick Evaluation

## 2023-07-20 NOTE — H&P (Signed)
 Pre-Procedure H&P   Patient ID: Luke Gibson is a 68 y.o. male.  Gastroenterology Provider: Quintin Buckle, DO  Referring Provider: Jamee Mazzoni, PA PCP: Monique Ano, MD  Date: 07/20/2023  HPI Mr. Luke Gibson is a 68 y.o. male who presents today for Colonoscopy for Personal history of colon polyps .  Last underwent colonoscopy in 2021 with 1 adenomatous polyp.  Is known to have a medium size transverse submucosal area previously diagnosed as a cyst on colonoscopy in 2010.  Biopsies were negative of this.  Reports 2-3 bowel movements per day no melena or hematochezia.  Appetite and stable.  Patient is undergoing peritoneal dialysis (last session last night). He did take abx prior to procedure today.  S/p chole  Cr 16.4; hgb 10.2 mcv 86.5 plt 151K   Past Medical History:  Diagnosis Date   Chronic kidney disease (CKD), stage IV (severe) (HCC)    Chronic metabolic acidosis    ED (erectile dysfunction)    Former smoker    HLD (hyperlipidemia)    HTN (hypertension)    Hyperparathyroidism due to renal insufficiency (HCC)    Hypothyroidism    Neuropathy    Obesity    OSA on CPAP    T2DM (type 2 diabetes mellitus) (HCC)    last a1c on 04-04-22 was 5.3    Past Surgical History:  Procedure Laterality Date   CAPD INSERTION N/A 08/31/2022   Procedure: LAPAROSCOPIC INSERTION CONTINUOUS AMBULATORY PERITONEAL DIALYSIS  (CAPD) CATHETER, embedded;  Surgeon: Flynn Hylan, MD;  Location: [facility] ORS;  Service: General;  Laterality: N/A;   CHOLECYSTECTOMY     COLONOSCOPY     HERNIA REPAIR      Family History No h/o GI disease or malignancy  Review of Systems  Constitutional:  Negative for activity change, appetite change, chills, diaphoresis, fatigue, fever and unexpected weight change.  HENT:  Negative for trouble swallowing and voice change.   Respiratory:  Negative for shortness of breath and wheezing.   Cardiovascular:  Negative for chest pain,  palpitations and leg swelling.  Gastrointestinal:  Negative for abdominal distention, abdominal pain, anal bleeding, blood in stool, constipation, diarrhea, nausea and vomiting.  Musculoskeletal:  Negative for arthralgias and myalgias.  Skin:  Negative for color change and pallor.  Neurological:  Negative for dizziness, syncope and weakness.  Psychiatric/Behavioral:  Negative for confusion. The patient is not nervous/anxious.   All other systems reviewed and are negative.    Medications No current facility-administered medications on file prior to encounter.   Current Outpatient Medications on File Prior to Encounter  Medication Sig Dispense Refill   amLODipine (NORVASC) 10 MG tablet Take 10 mg by mouth daily.     aspirin EC 81 MG tablet Take 1 tablet by mouth daily.     calcitRIOL (ROCALTROL) 0.25 MCG capsule Take 0.25 mcg by mouth daily.     calcium acetate (PHOSLO) 667 MG capsule Take 1,334 mg by mouth 3 (three) times daily.     Cholecalciferol 25 MCG (1000 UT) capsule Take 1 capsule by mouth every morning.     cinacalcet (SENSIPAR) 30 MG tablet Take 30 mg by mouth daily.     doxazosin (CARDURA) 4 MG tablet Take 4 mg by mouth at bedtime.     Garlic Oil 1000 MG CAPS Take 1 capsule by mouth daily.     irbesartan (AVAPRO) 150 MG tablet Take 150 mg by mouth daily.     levothyroxine (SYNTHROID) 125 MCG tablet Take 125 mcg  by mouth daily before breakfast.     sildenafil (VIAGRA) 100 MG tablet Take 100 mg by mouth as needed for erectile dysfunction.     sodium bicarbonate 650 MG tablet Take 1,300 mg by mouth 2 (two) times daily.      Pertinent medications related to GI and procedure were reviewed by me with the patient prior to the procedure   Current Facility-Administered Medications:    0.9 %  sodium chloride  infusion, , Intravenous, Continuous, Dimitriy, [place], DO, Last Rate: 20 mL/hr at 07/20/23 1020, New Bag at 07/20/23 1020  sodium chloride  20 mL/hr at 07/20/23 1020        No Known Allergies Allergies were reviewed by me prior to the procedure  Objective   Body mass index is 32.08 kg/m. Vitals:   07/20/23 1014  BP: (!) 145/88  Pulse: 76  Resp: 20  Temp: (!) 97.4 F (36.3 C)  TempSrc: Temporal  SpO2: 95%  Weight: 104.3 kg  Height: 5\' 11"  (1.803 m)     Physical Exam Vitals and nursing note reviewed.  Constitutional:      General: He is not in acute distress.    Appearance: Normal appearance. He is not ill-appearing, toxic-appearing or diaphoretic.  HENT:     Head: Normocephalic and atraumatic.     Nose: Nose normal.     Mouth/Throat:     Mouth: Mucous membranes are moist.     Pharynx: Oropharynx is clear.  Eyes:     General: No scleral icterus.    Extraocular Movements: Extraocular movements intact.  Cardiovascular:     Rate and Rhythm: Normal rate and regular rhythm.     Heart sounds: Normal heart sounds. No murmur heard.    No friction rub. No gallop.  Pulmonary:     Effort: Pulmonary effort is normal. No respiratory distress.     Breath sounds: Normal breath sounds. No wheezing, rhonchi or rales.  Abdominal:     General: Bowel sounds are normal. There is no distension.     Palpations: Abdomen is soft.     Tenderness: There is no abdominal tenderness. There is no guarding or rebound.     Comments: Peritoneal dialysis catheter coming from [facility] and taped to abdomen.  Musculoskeletal:     Cervical back: Neck supple.     Right lower leg: No edema.     Left lower leg: No edema.  Skin:    General: Skin is warm and dry.     Coloration: Skin is not jaundiced or pale.  Neurological:     General: No focal deficit present.     Mental Status: He is alert and oriented to person, place, and time. Mental status is at baseline.  Psychiatric:        Mood and Affect: Mood normal.        Behavior: Behavior normal.        Thought Content: Thought content normal.        Judgment: Judgment normal.      Assessment:  Luke Gibson  is a 68 y.o. male  who presents today for Colonoscopy for phx colon polyps.  Plan:  Colonoscopy with possible intervention today  Colonoscopy with possible biopsy, control of bleeding, polypectomy, and interventions as necessary has been discussed with the patient/patient representative. Informed consent was obtained from the patient/patient representative after explaining the indication, nature, and risks of the procedure including but not limited to death, bleeding, perforation, missed neoplasm/lesions, cardiorespiratory compromise, and reaction to  medications. Opportunity for questions was given and appropriate answers were provided. Patient/patient representative has verbalized understanding is amenable to undergoing the procedure.   Quintin Buckle, DO  [facility]  Portions of the record may have been created with voice recognition software. Occasional wrong-word or 'sound-a-like' substitutions may have occurred due to the inherent limitations of voice recognition software.  Read the chart carefully and recognize, using context, where substitutions may have occurred.

## 2023-07-20 NOTE — Anesthesia Postprocedure Evaluation (Signed)
 Anesthesia Post Note  Patient: Luke Gibson  Procedure(s) Performed: COLONOSCOPY POLYPECTOMY, INTESTINE  Patient location during evaluation: PACU Anesthesia Type: General Level of consciousness: awake and alert Pain management: pain level controlled Vital Signs Assessment: post-procedure vital signs reviewed and stable Respiratory status: spontaneous breathing, nonlabored ventilation, respiratory function stable and patient connected to nasal cannula oxygen Cardiovascular status: blood pressure returned to baseline and stable Postop Assessment: no apparent nausea or vomiting Anesthetic complications: no   No notable events documented.   Last Vitals:  Vitals:   07/20/23 1214 07/20/23 1224  BP: 132/84 133/87  Pulse: 76 78  Resp: 20 (!) 25  Temp:    SpO2: 93% 92%    Last Pain:  Vitals:   07/20/23 1224  TempSrc:   PainSc: 0-No pain                 Zula Hitch

## 2023-07-20 NOTE — Op Note (Signed)
 [facility] Gastroenterology Patient Name: Luke Gibson Procedure Date: 07/20/2023 10:39 AM MRN: [id number] Account #: [id number] Date of Birth: 1955-08-13 Admit Type: Outpatient Age: 68 Room: [facility] ENDO ROOM 1 Gender: Male Note Status: Finalized Instrument Name: Colonscope [id number] Procedure:             Colonoscopy Indications:           High risk colon cancer surveillance: Personal history                         of colonic polyps Providers:             Quintin Buckle DO, DO Medicines:             Monitored Anesthesia Care Complications:         No immediate complications. Estimated blood loss:                         Minimal. Procedure:             Pre-Anesthesia Assessment:                        - Prior to the procedure, a History and Physical was                         performed, and patient medications and allergies were                         reviewed. The patient is competent. The risks and                         benefits of the procedure and the sedation options and                         risks were discussed with the patient. All questions                         were answered and informed consent was obtained.                         Patient identification and proposed procedure were                         verified by the physician, the nurse, the anesthetist                         and the technician in the endoscopy suite. Mental                         Status Examination: alert and oriented. Airway                         Examination: normal oropharyngeal airway and neck                         mobility. Respiratory Examination: clear to                         auscultation. CV Examination: RRR, no murmurs, no S3  or S4. Prophylactic Antibiotics: The patient requires                         prophylactic antibiotics Peritoneal dialysis. Prior                         Anticoagulants: The patient has taken no  anticoagulant                         or antiplatelet agents. ASA Grade Assessment: III - A                         patient with severe systemic disease. After reviewing                         the risks and benefits, the patient was deemed in                         satisfactory condition to undergo the procedure. The                         anesthesia plan was to use monitored anesthesia care                         (MAC). Immediately prior to administration of                         medications, the patient was re-assessed for adequacy                         to receive sedatives. The heart rate, respiratory                         rate, oxygen saturations, blood pressure, adequacy of                         pulmonary ventilation, and response to care were                         monitored throughout the procedure. The physical                         status of the patient was re-assessed after the                         procedure.                        After obtaining informed consent, the colonoscope was                         passed under direct vision. Throughout the procedure,                         the patient's blood pressure, pulse, and oxygen                         saturations were monitored continuously. The  Colonoscope was introduced through the anus and                         advanced to the the cecum, identified by appendiceal                         orifice and ileocecal valve. The colonoscopy was                         performed without difficulty. The patient tolerated                         the procedure well. The quality of the bowel                         preparation was evaluated using the BBPS [facility] Bowel                         Preparation Scale) with scores of: Right Colon = 3                         (entire mucosa seen well with no residual staining,                         small fragments of stool or opaque liquid), Transverse                          Colon = 3 (entire mucosa seen well with no residual                         staining, small fragments of stool or opaque liquid)                         and Left Colon = 2 (minor amount of residual staining,                         small fragments of stool and/or opaque liquid, but                         mucosa seen well). The total BBPS score equals 8. The                         quality of the bowel preparation was excellent. The                         ileocecal valve, appendiceal orifice, and rectum were                         photographed. Findings:      The perianal and digital rectal examinations were normal. Pertinent       negatives include normal sphincter tone.      Multiple small-mouthed diverticula were found in the left colon.       Estimated blood loss: none.      Non-bleeding internal hemorrhoids were found during retroflexion. The       hemorrhoids were Grade I (internal hemorrhoids that do not prolapse).  Estimated blood loss: none.      A submucosal non-obstructing medium-sized mass was found in the       transverse colon. The mass was non-circumferential. In addition, its       diameter measured fourteen mm. No bleeding was present. previously       biopsied. benign. cystic lesion documented in past. Estimated blood       loss: none.      A 1 to 2 mm polyp was found in the descending colon. The polyp was       sessile. The polyp was removed with a jumbo cold forceps. Resection and       retrieval were complete. Estimated blood loss was minimal.      Ten sessile polyps were found in the descending colon (3) and transverse       colon (7). The polyps were 3 to 7 mm in size. These polyps were removed       with a cold snare. Resection and retrieval were complete. Estimated       blood loss was minimal.      A 10 to 11 mm polyp was found in the ascending colon. The polyp was       pedunculated. The polyp was removed with a hot snare.  Resection and       retrieval were complete. To prevent bleeding after the polypectomy, one       hemostatic clip was successfully placed (MR conditional). There was no       bleeding at the end of the procedure. Estimated blood loss was minimal.      A 6 to 7 mm polyp was found in the descending colon. The polyp was       pedunculated. The polyp was removed with a hot snare. Resection and       retrieval were complete. Estimated blood loss was minimal.      The exam was otherwise without abnormality on direct and retroflexion       views. Impression:            - Diverticulosis in the left colon.                        - Non-bleeding internal hemorrhoids.                        - Benign tumor in the transverse colon.                        - One 1 to 2 mm polyp in the descending colon, removed                         with a jumbo cold forceps. Resected and retrieved.                        - Ten 3 to 7 mm polyps in the descending colon and in                         the transverse colon, removed with a cold snare.                         Resected and retrieved.                        -  One 10 to 11 mm polyp in the ascending colon,                         removed with a hot snare. Resected and retrieved. Clip                         (MR conditional) was placed.                        - One 6 to 7 mm polyp in the descending colon, removed                         with a hot snare. Resected and retrieved.                        - The examination was otherwise normal on direct and                         retroflexion views. Recommendation:        - Patient has a contact number available for                         emergencies. The signs and symptoms of potential                         delayed complications were discussed with the patient.                         Return to normal activities tomorrow. Written                         discharge instructions were provided to the patient.                         - Discharge patient to home.                        - Resume previous diet.                        - Continue present medications.                        - No aspirin, ibuprofen, naproxen, or other                         non-steroidal anti-inflammatory drugs for 5 days after                         polyp removal.                        - Take flagyl/metronidazole 500mg  four times a day for                         three days starting today.                        - Await pathology results.                        -  Repeat colonoscopy in 1 year for surveillance based                         on pathology results.                        - Return to referring physician as previously                         scheduled.                        - The findings and recommendations were discussed with                         the patient. Procedure Code(s):     --- Professional ---                        [phone], Colonoscopy, flexible; with removal of                         tumor(s), polyp(s), or other lesion(s) by snare                         technique                        45380, 59, Colonoscopy, flexible; with biopsy, single                         or multiple Diagnosis Code(s):     --- Professional ---                        Z86.010, Personal history of colonic polyps                        K64.0, First degree hemorrhoids                        D12.3, Benign neoplasm of transverse colon (hepatic                         flexure or splenic flexure)                        D12.2, Benign neoplasm of ascending colon                        D12.4, Benign neoplasm of descending colon                        K57.30, Diverticulosis of large intestine without                         perforation or abscess without bleeding CPT copyright 2022 American [employer]. All rights reserved. The codes documented in this report are preliminary and upon coder review may  be revised to meet  current compliance requirements. Attending Participation:      I personally performed the entire procedure. Polo Brisk, DO Quintin Buckle DO, DO 07/20/2023 11:46:40 AM This report has been signed electronically. Number of Addenda:  0 Note Initiated On: 07/20/2023 10:39 AM Scope Withdrawal Time: 0 hours 28 minutes 21 seconds  Total Procedure Duration: 0 hours 32 minutes 2 seconds  Estimated Blood Loss:  Estimated blood loss was minimal.      [facility]

## 2023-07-20 NOTE — Transfer of Care (Signed)
 Immediate Anesthesia Transfer of Care Note  Patient: Luke Gibson  Procedure(s) Performed: COLONOSCOPY POLYPECTOMY, INTESTINE  Patient Location: Endoscopy Unit  Anesthesia Type:General  Level of Consciousness: drowsy and patient cooperative  Airway & Oxygen Therapy: Patient Spontanous Breathing and Patient connected to face mask oxygen  Post-op Assessment: Report given to RN and Post -op Vital signs reviewed and stable  Post vital signs: Reviewed and stable  Last Vitals:  Vitals Value Taken Time  BP 108/74 07/20/23 1149  Temp 36.1 C 07/20/23 1138  Pulse 79 07/20/23 1153  Resp 26 07/20/23 1153  SpO2 87 % 07/20/23 1153  Vitals shown include unfiled device data.  Last Pain:  Vitals:   07/20/23 1138  TempSrc: Temporal  PainSc: Asleep         Complications: No notable events documented.

## 2023-07-20 NOTE — Interval H&P Note (Signed)
 History and Physical Interval Note: Preprocedure H&P from 07/20/23  was reviewed and there was no interval change after seeing and examining the patient.  Written consent was obtained from the patient after discussion of risks, benefits, and alternatives. Patient has consented to proceed with Colonoscopy with possible intervention   07/20/2023 10:49 AM  Luke Gibson  has presented today for surgery, with the diagnosis of Hx of adenomatous polyp of colon (Z86.0101).  The various methods of treatment have been discussed with the patient and family. After consideration of risks, benefits and other options for treatment, the patient has consented to  Procedure(s): COLONOSCOPY (N/A) as a surgical intervention.  The patient's history has been reviewed, patient examined, no change in status, stable for surgery.  I have reviewed the patient's chart and labs.  Questions were answered to the patient's satisfaction.     Quintin Buckle

## 2023-07-21 ENCOUNTER — Encounter: Payer: Self-pay | Admitting: Gastroenterology

## 2023-07-21 LAB — SURGICAL PATHOLOGY

## 2023-08-03 ENCOUNTER — Encounter: Payer: Self-pay | Admitting: Gastroenterology

## 2024-01-27 NOTE — Progress Notes (Unsigned)
 [facility] [address] [place], [place] [address]  Pulmonary Sleep Medicine   Office Visit Note  Patient Name: Luke Gibson DOB: Jun 11, 1955 MRN [id number]    Chief Complaint: Obstructive Sleep Apnea visit  Brief History:  Luke Gibson is seen today for an annual follow up on CPAP @ 10 cmH2O. The patient has a 17 year history of sleep apnea. Patient is using PAP nightly.  The patient feels rested after sleeping with PAP.  The patient reports benefiting from PAP use. Reported sleepiness is  improved and the Epworth Sleepiness Score is 2 out of 24. The patient does not take naps. The patient complains of the following: none.  The compliance download shows 100%  compliance with an average use time of 8 hours 19 minute. The AHI is 4.3  The patient does not complain of limb movements disrupting sleep. The patient continues to require PAP therapy in order to eliminate sleep apnea.   ROS  General: (-) fever, (-) chills, (-) night sweat Nose and Sinuses: (-) nasal stuffiness or itchiness, (-) postnasal drip, (-) nosebleeds, (-) sinus trouble. Mouth and Throat: (-) sore throat, (-) hoarseness. Neck: (-) swollen glands, (-) enlarged thyroid, (-) neck pain. Respiratory: - cough, - shortness of breath, - wheezing. Neurologic: - numbness, - tingling. Psychiatric: - anxiety, - depression   Current Medication: Outpatient Encounter Medications as of 01/29/2024  Medication Sig   amLODipine (NORVASC) 10 MG tablet Take 10 mg by mouth daily.   aspirin EC 81 MG tablet Take 1 tablet by mouth daily.   calcitRIOL (ROCALTROL) 0.25 MCG capsule Take 0.25 mcg by mouth daily.   calcium acetate (PHOSLO) 667 MG capsule Take 1,334 mg by mouth 3 (three) times daily.   Cholecalciferol 25 MCG (1000 UT) capsule Take 1 capsule by mouth every morning.   cinacalcet (SENSIPAR) 30 MG tablet Take 30 mg by mouth daily.   doxazosin (CARDURA) 4 MG tablet Take 4 mg by mouth at bedtime.   furosemide (LASIX)  80 MG tablet Take 80 mg by mouth 2 (two) times daily.   Garlic Oil 1000 MG CAPS Take 1 capsule by mouth daily.   irbesartan (AVAPRO) 150 MG tablet Take 150 mg by mouth daily.   levothyroxine (SYNTHROID) 125 MCG tablet Take 125 mcg by mouth daily before breakfast.   pregabalin (LYRICA) 25 MG capsule Take 25 mg by mouth 2 (two) times daily.   sildenafil (VIAGRA) 100 MG tablet Take 100 mg by mouth as needed for erectile dysfunction.   sodium bicarbonate 650 MG tablet Take 1,300 mg by mouth 2 (two) times daily.   No facility-administered encounter medications on file as of 01/29/2024.    Surgical History: Past Surgical History:  Procedure Laterality Date   CAPD INSERTION N/A 08/31/2022   Procedure: LAPAROSCOPIC INSERTION CONTINUOUS AMBULATORY PERITONEAL DIALYSIS  (CAPD) CATHETER, embedded;  Surgeon: Lane Shope, MD;  Location: [facility] ORS;  Service: General;  Laterality: N/A;   CHOLECYSTECTOMY     COLONOSCOPY     COLONOSCOPY N/A 07/20/2023   Procedure: COLONOSCOPY;  Surgeon: Onita Elspeth Ozell, DO;  Location: [facility] ENDOSCOPY;  Service: Gastroenterology;  Laterality: N/A;   HEMOSTASIS CLIP PLACEMENT  07/20/2023   Procedure: CONTROL OF HEMORRHAGE, GI TRACT, ENDOSCOPIC, BY CLIPPING OR OVERSEWING;  Surgeon: Onita Elspeth Ozell, DO;  Location: [facility] ENDOSCOPY;  Service: Gastroenterology;;   HERNIA REPAIR     POLYPECTOMY  07/20/2023   Procedure: POLYPECTOMY, INTESTINE;  Surgeon: Onita Elspeth Ozell, DO;  Location: [facility] ENDOSCOPY;  Service: Gastroenterology;;  Medical History: Past Medical History:  Diagnosis Date   Chronic kidney disease (CKD), stage IV (severe) (HCC)    Chronic metabolic acidosis    ED (erectile dysfunction)    Former smoker    HLD (hyperlipidemia)    HTN (hypertension)    Hyperparathyroidism due to renal insufficiency    Hypothyroidism    Neuropathy    Obesity    OSA on CPAP    T2DM (type 2 diabetes mellitus) (HCC)    last a1c on 04-04-22 was 5.3    Family  History: Non contributory to the present illness  Social History: Social History   Socioeconomic History   Marital status: Widowed    Spouse name: Not on file   Number of children: Not on file   Years of education: Not on file   Highest education level: Not on file  Occupational History   Not on file  Tobacco Use   Smoking status: Former    Current packs/day: 0.00    Types: Cigarettes    Quit date: 2019    Years since quitting: 6.8    Passive exposure: Past   Smokeless tobacco: Never  Vaping Use   Vaping status: Former  Substance and Sexual Activity   Alcohol use: Yes    Comment: rare   Drug use: Never   Sexual activity: Not on file  Other Topics Concern   Not on file  Social History Narrative   Not on file   Social Drivers of Health   Financial Resource Strain: Low Risk  (10/10/2023)   Received from [facility]   Overall Financial Resource Strain (CARDIA)    Difficulty of Paying Living Expenses: Not hard at all  Food Insecurity: No Food Insecurity (10/10/2023)   Received from [facility]   Hunger Vital Sign    Within the past 12 months, you worried that your food would run out before you got the money to buy more.: Never true    Within the past 12 months, the food you bought just didn't last and you didn't have money to get more.: Never true  Transportation Needs: No Transportation Needs (10/10/2023)   Received from [facility]   PRAPARE - Transportation    Lack of Transportation (Non-Medical): No    In the past 12 months, has lack of transportation kept you from medical appointments or from getting medications?: No  Physical Activity: Not on file  Stress: Not on file  Social Connections: Not on file  Intimate Partner Violence: Not on file    Vital Signs: Blood pressure 113/73, pulse 72, resp. rate 16, height 5' 11 (1.803 m), weight 218 lb (98.9 kg), SpO2 98%. Body mass index is 30.4 kg/m.     Examination: General Appearance: The patient is well-developed, well-nourished, and in no distress. Neck Circumference: 43 cm Skin: Gross inspection of skin unremarkable. Head: normocephalic, no gross deformities. Eyes: no gross deformities noted. ENT: ears appear grossly normal Neurologic: Alert and oriented. No involuntary movements.  STOP BANG RISK ASSESSMENT S (snore) Have you been told that you snore?     NO   T (tired) Are you often tired, fatigued, or sleepy during the day?   NO  O (obstruction) Do you stop breathing, choke, or gasp during sleep? NO   P (pressure) Do you have or are you being treated for high blood pressure? YES   B (BMI) Is your body index greater than 35 kg/m? NO   A (age)  Are you 20 years old or older? YES   N (neck) Do you have a neck circumference greater than 16 inches?   YES   G (gender) Are you a male? YES   TOTAL STOP/BANG "YES" ANSWERS 4       A STOP-Bang score of 2 or less is considered low risk, and a score of 5 or more is high risk for having either moderate or severe OSA. For people who score 3 or 4, doctors may need to perform further assessment to determine how likely they are to have OSA.         EPWORTH SLEEPINESS SCALE:  Scale:  (0)= no chance of dozing; (1)= slight chance of dozing; (2)= moderate chance of dozing; (3)= high chance of dozing  Chance  Situtation    Sitting and reading: 0    Watching TV: 0    Sitting Inactive in public: 0    As a passenger in car: 1      Lying down to rest: 1    Sitting and talking: 0    Sitting quielty after lunch: 0    In a car, stopped in traffic: 0   TOTAL SCORE:   2 out of 24    SLEEP STUDIES:  Split Study (10/2006) AHI 114/hr, min SP02 81%, CPAP @ 10 cmH2O.   CPAP COMPLIANCE DATA:  Date Range: 01/25/2023 - 01/24/2024  Average Daily Use: 8 hours 21 minutes  Median Use: 8 hours 46 minutes  Compliance for > 4 Hours: 100% days  AHI: 4.3 respiratory events per  hour  Days Used: 364/365  Mask Leak: 8.0  95th Percentile Pressure: 10 cmH2O         LABS: No results found for this or any previous visit (from the past 2160 hours).  Radiology: No results found.  No results found.  No results found.    Assessment and Plan: Patient Active Problem List   Diagnosis Date Noted   Tobacco use disorder 01/16/2023   ESRD (end stage renal disease) (HCC) 01/12/2023   Hyperparathyroidism due to renal insufficiency 04/12/2022   Chronic kidney disease, stage 3 (HCC) 01/17/2022   Obesity (BMI 30-39.9) 01/18/2021   Hypertension 01/18/2021   CKD (chronic kidney disease) stage 4, GFR 15-29 ml/min (HCC) 01/18/2021   Erectile dysfunction 01/18/2021   Type 2 diabetes mellitus with stage 4 chronic kidney disease, without long-term current use of insulin (HCC) 01/18/2021   Essential hypertension 01/18/2021   Acquired hypothyroidism 01/18/2021   Type 2 diabetes mellitus with stage 5 chronic kidney disease not on chronic dialysis, without long-term current use of insulin (HCC) 01/18/2021   OSA on CPAP 01/20/2020   CPAP use counseling 01/20/2020   Hypothyroidism 01/20/2020   BMI 36.0-36.9,adult 01/20/2020   Pure hypercholesterolemia 08/31/2016   1. OSA on CPAP (Primary)  The patient does tolerate PAP and reports  benefit from PAP use. The patient was reminded how to clean equipment and advised to replace supplies routinely. The patient was also counselled on weight loss. The compliance is excellent. The AHI is 4.3.   OSA on cpap- controlled. Continue with excellent compliance with pap. CPAP continues to be medically necessary to treat this patient's OSA. F/u one year.   2. CPAP use counseling CPAP Counseling: had a lengthy discussion with the patient regarding the importance of PAP therapy in management of the sleep apnea. Patient appears to understand the risk factor reduction and also understands the risks associated with untreated sleep apnea.  Patient will  try to make a good faith effort to remain compliant with therapy. Also instructed the patient on proper cleaning of the device including the water  must be changed daily if possible and use of distilled water  is preferred. Patient understands that the machine should be regularly cleaned with appropriate recommended cleaning solutions that do not damage the PAP machine for example given white vinegar and water  rinses. Other methods such as ozone treatment may not be as good as these simple methods to achieve cleaning.   3. Obesity (BMI 30-39.9) Obesity Counseling: Had a lengthy discussion regarding patients BMI and weight issues. Patient was instructed on portion control as well as increased activity. Also discussed caloric restrictions with trying to maintain intake less than 2000 Kcal. Discussions were made in accordance with the 5As of weight management. Simple actions such as not eating late and if able to, taking a walk is suggested.      General Counseling: I have discussed the findings of the evaluation and examination with Ozell.  I have also discussed any further diagnostic evaluation thatmay be needed or ordered today. Nigil verbalizes understanding of the findings of todays visit. We also reviewed his medications today and discussed drug interactions and side effects including but not limited excessive drowsiness and altered mental states. We also discussed that there is always a risk not just to him but also people around him. he has been encouraged to call the office with any questions or concerns that should arise related to todays visit.  No orders of the defined types were placed in this encounter.       I have personally obtained a history, examined the patient, evaluated laboratory and imaging results, formulated the assessment and plan and placed orders. This patient was seen today by Lauraine Lay, PA-C in collaboration with Dr. Elfreda Bathe.   Elfreda DELENA Bathe, MD  [facility] Diplomate ABMS Pulmonary Critical Care Medicine and Sleep Medicine

## 2024-01-29 ENCOUNTER — Ambulatory Visit (INDEPENDENT_AMBULATORY_CARE_PROVIDER_SITE_OTHER): Admitting: Internal Medicine

## 2024-01-29 VITALS — BP 113/73 | HR 72 | Resp 16 | Ht 71.0 in | Wt 218.0 lb

## 2024-01-29 DIAGNOSIS — G4733 Obstructive sleep apnea (adult) (pediatric): Secondary | ICD-10-CM | POA: Diagnosis not present

## 2024-01-29 DIAGNOSIS — E669 Obesity, unspecified: Secondary | ICD-10-CM | POA: Diagnosis not present

## 2024-01-29 DIAGNOSIS — Z7189 Other specified counseling: Secondary | ICD-10-CM | POA: Diagnosis not present

## 2024-01-29 NOTE — Patient Instructions (Signed)
